# Patient Record
Sex: Female | Born: 1965
Health system: Southern US, Community
[De-identification: ages and names within clinical notes are randomized; demographics above are authoritative.]

## PROBLEM LIST (undated history)

## (undated) DIAGNOSIS — M1990 Unspecified osteoarthritis, unspecified site: Secondary | ICD-10-CM

## (undated) DIAGNOSIS — G4700 Insomnia, unspecified: Secondary | ICD-10-CM

## (undated) DIAGNOSIS — R197 Diarrhea, unspecified: Secondary | ICD-10-CM

## (undated) DIAGNOSIS — K589 Irritable bowel syndrome without diarrhea: Secondary | ICD-10-CM

## (undated) DIAGNOSIS — T8859XA Other complications of anesthesia, initial encounter: Secondary | ICD-10-CM

## (undated) DIAGNOSIS — Z86010 Personal history of colonic polyps: Secondary | ICD-10-CM

## (undated) DIAGNOSIS — R05 Cough: Secondary | ICD-10-CM

## (undated) DIAGNOSIS — M2540 Effusion, unspecified joint: Secondary | ICD-10-CM

## (undated) DIAGNOSIS — F32A Depression, unspecified: Secondary | ICD-10-CM

## (undated) DIAGNOSIS — M549 Dorsalgia, unspecified: Secondary | ICD-10-CM

## (undated) DIAGNOSIS — N393 Stress incontinence (female) (male): Secondary | ICD-10-CM

## (undated) DIAGNOSIS — M5030 Other cervical disc degeneration, unspecified cervical region: Secondary | ICD-10-CM

## (undated) DIAGNOSIS — R059 Cough, unspecified: Secondary | ICD-10-CM

## (undated) DIAGNOSIS — E039 Hypothyroidism, unspecified: Secondary | ICD-10-CM

## (undated) DIAGNOSIS — Z8709 Personal history of other diseases of the respiratory system: Secondary | ICD-10-CM

## (undated) DIAGNOSIS — G8929 Other chronic pain: Secondary | ICD-10-CM

## (undated) DIAGNOSIS — J189 Pneumonia, unspecified organism: Secondary | ICD-10-CM

## (undated) DIAGNOSIS — M2550 Pain in unspecified joint: Secondary | ICD-10-CM

## (undated) DIAGNOSIS — T4145XA Adverse effect of unspecified anesthetic, initial encounter: Secondary | ICD-10-CM

## (undated) DIAGNOSIS — K219 Gastro-esophageal reflux disease without esophagitis: Secondary | ICD-10-CM

## (undated) DIAGNOSIS — F329 Major depressive disorder, single episode, unspecified: Secondary | ICD-10-CM

## (undated) HISTORY — PX: EYE SURGERY: SHX253

## (undated) HISTORY — PX: COLONOSCOPY: SHX174

## (undated) HISTORY — PX: CERVICAL SPINE SURGERY: SHX589

## (undated) HISTORY — PX: BACK SURGERY: SHX140

## (undated) HISTORY — PX: CHOLECYSTECTOMY: SHX55

## (undated) HISTORY — PX: ESOPHAGOGASTRODUODENOSCOPY: SHX1529

## (undated) HISTORY — PX: ABDOMINAL HYSTERECTOMY: SHX81

---

## 2004-06-26 ENCOUNTER — Ambulatory Visit: Payer: Self-pay | Admitting: Family Medicine

## 2004-07-23 ENCOUNTER — Ambulatory Visit: Payer: Self-pay | Admitting: Family Medicine

## 2004-09-01 ENCOUNTER — Emergency Department: Payer: Self-pay | Admitting: General Practice

## 2005-09-13 IMAGING — CT CT ANGIOGRAPHY NECK
1 of 8 series · 8 of 33 positions shown · IV contrast (APPLIED)
Comparison: none

REASON FOR EXAM: ABN MRI showed asymmetric signal intensity LT distal
internal carotid
COMMENTS:

[Series 6: inspace · axial · 0.33mm/px · z∈[+90,+294]mm · 8 of 522 slices shown]
[im 58/522  soft-tissue]
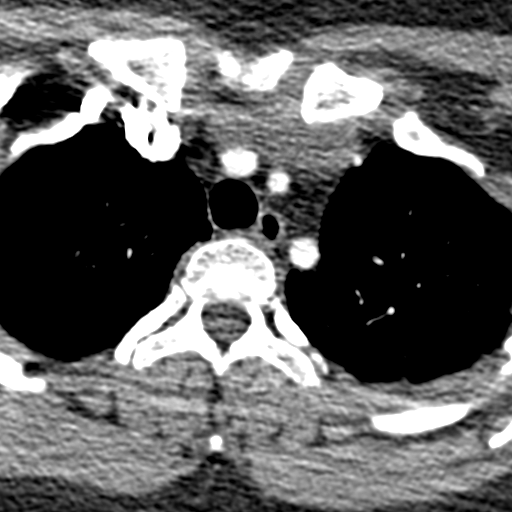
[im 116/522  bone]
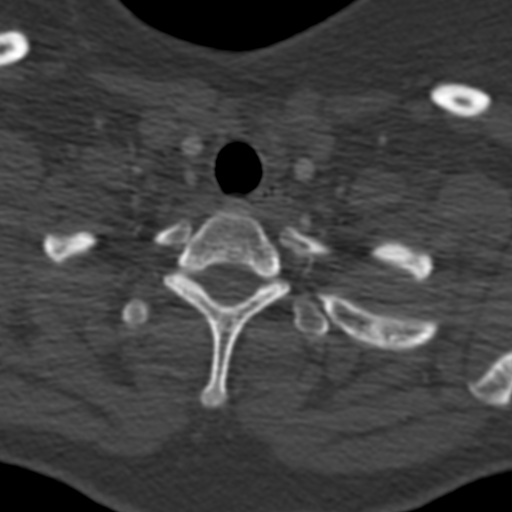
[im 174/522  soft-tissue]
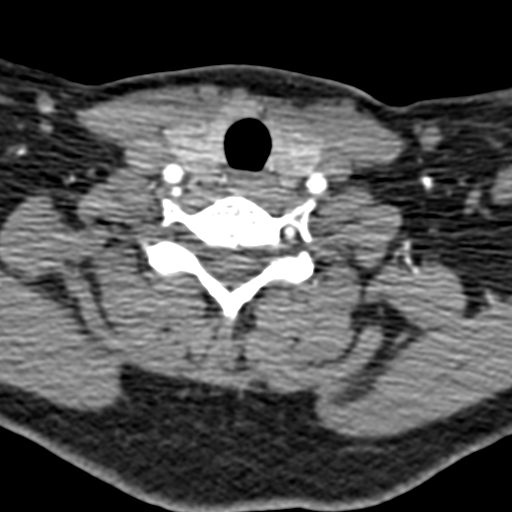
[im 232/522  bone]
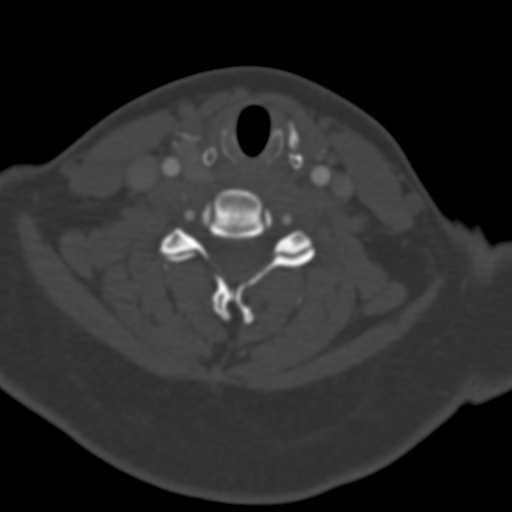
[im 290/522  soft-tissue]
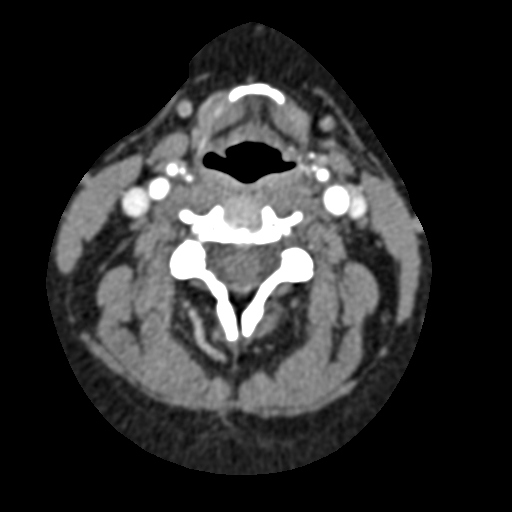
[im 348/522  bone]
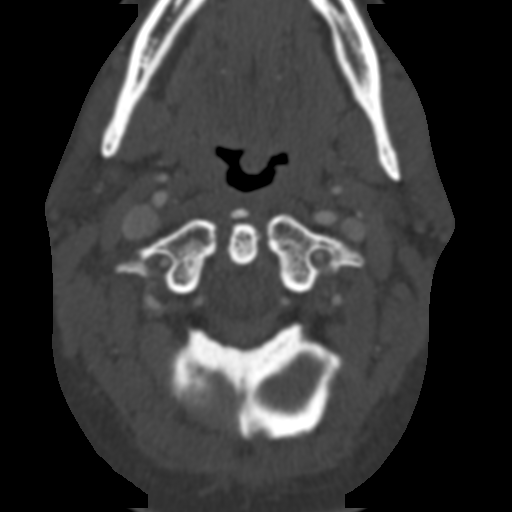
[im 406/522  soft-tissue]
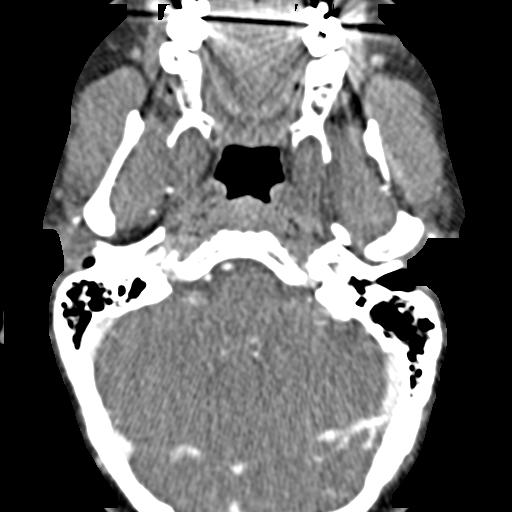
[im 464/522  bone]
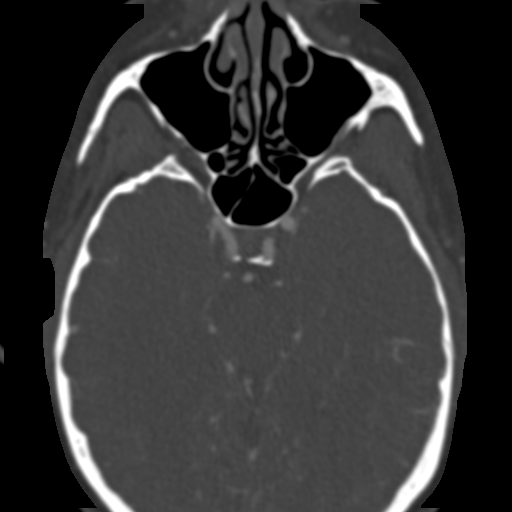

[8 of 33 positions shown; findings below may reference images not displayed]

PROCEDURE:     CT  - CT ANGIOGRAPHY NECK W/WO  - July 23, 2004 [DATE]

RESULT:     Axial and coronal maximum intensity projections as well as 3D
reconstructions and vessel view images were performed of the RIGHT and LEFT
carotid systems status post intravenous administration of 100 ccs of Isovue
370.  Evaluation of the RIGHT and LEFT carotid systems demonstrates no
regions of strictural narrowing, fusiform dilatation, segmental stenosis nor
focal outpouchings.  There does not appear to be evidence of significant
mural calcifications.
IMPRESSION: 1)Unremarkable bilateral CT carotid angiogram as described above.

## 2005-12-04 ENCOUNTER — Emergency Department: Payer: Self-pay | Admitting: Unknown Physician Specialty

## 2008-02-24 ENCOUNTER — Ambulatory Visit: Payer: Self-pay | Admitting: Obstetrics and Gynecology

## 2008-03-02 ENCOUNTER — Ambulatory Visit: Payer: Self-pay | Admitting: Obstetrics and Gynecology

## 2008-04-05 ENCOUNTER — Observation Stay: Payer: Self-pay | Admitting: Unknown Physician Specialty

## 2009-06-29 ENCOUNTER — Inpatient Hospital Stay: Payer: Self-pay | Admitting: Psychiatry

## 2012-01-05 ENCOUNTER — Ambulatory Visit: Payer: Self-pay | Admitting: Emergency Medicine

## 2012-10-04 ENCOUNTER — Emergency Department: Payer: Self-pay | Admitting: Emergency Medicine

## 2012-11-03 ENCOUNTER — Emergency Department: Payer: Self-pay | Admitting: Emergency Medicine

## 2012-11-03 LAB — BASIC METABOLIC PANEL
BUN: 22 mg/dL — ABNORMAL HIGH (ref 7–18)
Chloride: 105 mmol/L (ref 98–107)
Creatinine: 1.1 mg/dL (ref 0.60–1.30)
Glucose: 92 mg/dL (ref 65–99)
Potassium: 4 mmol/L (ref 3.5–5.1)

## 2012-11-03 LAB — CBC
HGB: 12.9 g/dL (ref 12.0–16.0)
MCHC: 34.9 g/dL (ref 32.0–36.0)
Platelet: 279 10*3/uL (ref 150–440)
RBC: 4.12 10*6/uL (ref 3.80–5.20)
WBC: 7.5 10*3/uL (ref 3.6–11.0)

## 2012-11-04 ENCOUNTER — Ambulatory Visit: Payer: Self-pay | Admitting: Family Medicine

## 2012-11-29 ENCOUNTER — Ambulatory Visit: Payer: Self-pay | Admitting: Specialist

## 2013-04-28 DIAGNOSIS — Z8709 Personal history of other diseases of the respiratory system: Secondary | ICD-10-CM

## 2013-04-28 HISTORY — DX: Personal history of other diseases of the respiratory system: Z87.09

## 2014-01-21 ENCOUNTER — Emergency Department: Payer: Self-pay | Admitting: Emergency Medicine

## 2014-01-21 LAB — URINALYSIS, COMPLETE
Bacteria: NONE SEEN
Bilirubin,UR: NEGATIVE
Glucose,UR: NEGATIVE mg/dL (ref 0–75)
Hyaline Cast: 6
Ketone: NEGATIVE
LEUKOCYTE ESTERASE: NEGATIVE
Nitrite: NEGATIVE
PROTEIN: NEGATIVE
Ph: 5 (ref 4.5–8.0)
Specific Gravity: 1.02 (ref 1.003–1.030)
WBC UR: NONE SEEN /HPF (ref 0–5)

## 2014-01-21 LAB — COMPREHENSIVE METABOLIC PANEL
ALBUMIN: 3.9 g/dL (ref 3.4–5.0)
ANION GAP: 5 — AB (ref 7–16)
Alkaline Phosphatase: 80 U/L
BILIRUBIN TOTAL: 0.5 mg/dL (ref 0.2–1.0)
BUN: 15 mg/dL (ref 7–18)
CALCIUM: 8.9 mg/dL (ref 8.5–10.1)
CHLORIDE: 104 mmol/L (ref 98–107)
CREATININE: 0.97 mg/dL (ref 0.60–1.30)
Co2: 31 mmol/L (ref 21–32)
EGFR (African American): >60
EGFR (Non-African Amer.): >60
Glucose: 110 mg/dL — ABNORMAL HIGH (ref 65–99)
Osmolality: 281 (ref 275–301)
POTASSIUM: 3.7 mmol/L (ref 3.5–5.1)
SGOT(AST): 37 U/L (ref 15–37)
SGPT (ALT): 30 U/L
Sodium: 140 mmol/L (ref 136–145)
Total Protein: 7.2 g/dL (ref 6.4–8.2)

## 2014-01-21 LAB — CBC
HCT: 39.8 % (ref 35.0–47.0)
HGB: 13 g/dL (ref 12.0–16.0)
MCH: 29.8 pg (ref 26.0–34.0)
MCHC: 32.7 g/dL (ref 32.0–36.0)
MCV: 91 fL (ref 80–100)
PLATELETS: 269 10*3/uL (ref 150–440)
RBC: 4.37 10*6/uL (ref 3.80–5.20)
RDW: 13.3 % (ref 11.5–14.5)
WBC: 5.8 10*3/uL (ref 3.6–11.0)

## 2014-01-21 LAB — LIPASE, BLOOD: LIPASE: 158 U/L (ref 73–393)

## 2014-01-25 ENCOUNTER — Ambulatory Visit: Payer: Self-pay | Admitting: Obstetrics and Gynecology

## 2014-03-13 ENCOUNTER — Emergency Department: Payer: Self-pay | Admitting: Emergency Medicine

## 2014-07-17 ENCOUNTER — Emergency Department: Payer: Self-pay | Admitting: Emergency Medicine

## 2014-11-07 ENCOUNTER — Other Ambulatory Visit: Payer: Self-pay | Admitting: *Deleted

## 2014-11-07 DIAGNOSIS — M545 Low back pain, unspecified: Secondary | ICD-10-CM

## 2014-11-07 DIAGNOSIS — M5440 Lumbago with sciatica, unspecified side: Secondary | ICD-10-CM

## 2014-11-07 DIAGNOSIS — M5136 Other intervertebral disc degeneration, lumbar region: Secondary | ICD-10-CM

## 2014-11-14 ENCOUNTER — Ambulatory Visit
Admission: RE | Admit: 2014-11-14 | Discharge: 2014-11-14 | Disposition: A | Discharge status: Home / Self Care | Payer: 59 | Source: Ambulatory Visit | Attending: Orthopedic Surgery | Admitting: Orthopedic Surgery

## 2014-11-14 DIAGNOSIS — M5136 Other intervertebral disc degeneration, lumbar region: Secondary | ICD-10-CM | POA: Diagnosis present

## 2014-11-14 DIAGNOSIS — M5440 Lumbago with sciatica, unspecified side: Secondary | ICD-10-CM

## 2014-11-14 DIAGNOSIS — M545 Low back pain, unspecified: Secondary | ICD-10-CM

## 2015-03-24 ENCOUNTER — Emergency Department: Payer: 59

## 2015-03-24 ENCOUNTER — Encounter: Payer: Self-pay | Admitting: Emergency Medicine

## 2015-03-24 ENCOUNTER — Emergency Department
Admission: EM | Admit: 2015-03-24 | Discharge: 2015-03-24 | Disposition: A | Discharge status: Home / Self Care | Payer: 59 | Attending: Emergency Medicine | Admitting: Emergency Medicine

## 2015-03-24 DIAGNOSIS — Y9389 Activity, other specified: Secondary | ICD-10-CM | POA: Diagnosis not present

## 2015-03-24 DIAGNOSIS — S29002A Unspecified injury of muscle and tendon of back wall of thorax, initial encounter: Secondary | ICD-10-CM | POA: Diagnosis present

## 2015-03-24 DIAGNOSIS — W1839XA Other fall on same level, initial encounter: Secondary | ICD-10-CM | POA: Diagnosis not present

## 2015-03-24 DIAGNOSIS — Z880 Allergy status to penicillin: Secondary | ICD-10-CM | POA: Diagnosis not present

## 2015-03-24 DIAGNOSIS — Y9289 Other specified places as the place of occurrence of the external cause: Secondary | ICD-10-CM | POA: Insufficient documentation

## 2015-03-24 DIAGNOSIS — S20221A Contusion of right back wall of thorax, initial encounter: Secondary | ICD-10-CM | POA: Diagnosis not present

## 2015-03-24 DIAGNOSIS — F17210 Nicotine dependence, cigarettes, uncomplicated: Secondary | ICD-10-CM | POA: Insufficient documentation

## 2015-03-24 DIAGNOSIS — Y998 Other external cause status: Secondary | ICD-10-CM | POA: Insufficient documentation

## 2015-03-24 MED ORDER — ORPHENADRINE CITRATE 30 MG/ML IJ SOLN
60.0000 mg | Freq: Two times a day (BID) | INTRAMUSCULAR | Status: DC
  Administered 2015-03-24: 60 mg via INTRAMUSCULAR
  Filled 2015-03-24: qty 2

## 2015-03-24 MED ORDER — HYDROMORPHONE HCL 1 MG/ML IJ SOLN
1.0000 mg | Freq: Once | INTRAMUSCULAR | Status: AC
  Administered 2015-03-24: 1 mg via INTRAMUSCULAR
  Filled 2015-03-24: qty 1

## 2015-03-24 NOTE — ED Provider Notes (Signed)
[facility] Emergency Department Provider Note  ____________________________________________  Time seen: Approximately 10:07 PM  I have reviewed the triage vital signs and the nursing notes.   HISTORY  Chief Complaint Back Pain    HPI Veronica Flynn is a 49 y.o. female patient complaining of for back pain secondary to a fall. Patient fell 4 days ago and fractured her right elbow. She got up and back pain at that time but noticed a "progressively worse over the past 3 days. Patient states the pain also seems increases when she takes a deep breath. Patient rated the pain as a 10 over 10.Patient is on chronic pain management.   History reviewed. No pertinent past medical history.  There are no active problems to display for this patient.   Past Surgical History  Procedure Laterality Date  . Cervical spine surgery    . Abdominal hysterectomy      No current outpatient prescriptions on file.  Allergies Penicillins; Sulfa antibiotics; and Tramadol  No family history on file.  Social History Social History  Substance Use Topics  . Smoking status: Current Every Day Smoker -- 0.50 packs/day    Types: Cigarettes  . Smokeless tobacco: None  . Alcohol Use: No    Review of Systems Constitutional: No fever/chills Eyes: No visual changes. ENT: No sore throat. Cardiovascular: Denies chest pain. Respiratory: Denies shortness of breath. Gastrointestinal: No abdominal pain.  No nausea, no vomiting.  No diarrhea.  No constipation. Genitourinary: Negative for dysuria. Musculoskeletal: Positive upper back pain.  Skin: Negative for rash. Neurological: Negative for headaches, focal weakness or numbness. 10-point ROS otherwise negative.  ____________________________________________   PHYSICAL EXAM:  VITAL SIGNS: ED Triage Vitals  Enc Vitals Group     BP 03/24/15 2142 132/77 mmHg     Pulse Rate 03/24/15 2142 73     Resp 03/24/15 2142 20     Temp  03/24/15 2142 98.4 F (36.9 C)     Temp Source 03/24/15 2142 Oral     SpO2 03/24/15 2142 96 %     Weight --      Height --      Head Cir --      Peak Flow --      Pain Score 03/24/15 2143 10     Pain Loc --      Pain Edu? --      Excl. in GC? --     Constitutional: Alert and oriented. Well appearing and in no acute distress. Eyes: Conjunctivae are normal. PERRL. EOMI. Head: Atraumatic. Nose: No congestion/rhinnorhea. Mouth/Throat: Mucous membranes are moist.  Oropharynx non-erythematous. Neck: No stridor.  No cervical spine tenderness to palpation. Hematological/Lymphatic/Immunilogical: No cervical lymphadenopathy. Cardiovascular: Normal rate, regular rhythm. Grossly normal heart sounds.  Good peripheral circulation. Respiratory: Normal respiratory effort.  No retractions. Lungs CTAB. Gastrointestinal: Soft and nontender. No distention. No abdominal bruits. No CVA tenderness. Musculoskeletal: Surgical scar on lower C-spine area consistent history of prior surgery. No spinal deformity. Patient is tender palpation T6 and 7. Patient has decreased range of motion right upper extremity secondary to a fractured elbow. .Neurologic:  Normal speech and language. No gross focal neurologic deficits are appreciated. No gait instability. Skin:  Skin is warm, dry and intact. No rash noted. Psychiatric: Mood and affect are normal. Speech and behavior are normal.  ____________________________________________   LABS (all labs ordered are listed, but only abnormal results are displayed)  Labs Reviewed - No data to display ____________________________________________  EKG  ____________________________________________  RADIOLOGY  No acute findings on x-ray. I, Joni Reiningonald K Soua Caltagirone, personally viewed and evaluated these images (plain radiographs) as part of my medical decision making.   ____________________________________________   PROCEDURES  Procedure(s) performed:   Critical Care  performed: No  ____________________________________________   INITIAL IMPRESSION / ASSESSMENT AND PLAN / ED COURSE  Pertinent labs & imaging results that were available during my care of the patient were reviewed by me and considered in my medical decision making (see chart for details).  Upper back contusion. Discussed x-ray findings with patient. Patient advised to continue narcotic pain medications from her provider. Last follow-up with PCP if her complaint upper back pain persists. ____________________________________________   FINAL CLINICAL IMPRESSION(S) / ED DIAGNOSES  Final diagnoses:  Contusion of upper back, right, initial encounter      Joni ReiningRonald K Torrion Witter, PA-C 03/24/15 2249  Minna AntisKevin Paduchowski, MD 03/24/15 2329

## 2015-03-24 NOTE — Discharge Instructions (Signed)

## 2015-03-24 NOTE — ED Notes (Signed)
Patient with no complaints at this time. Respirations even and unlabored. Skin warm/dry. Discharge instructions reviewed with patient at this time. Patient given opportunity to voice concerns/ask questions. Patient discharged at this time and left Emergency Department, via wheelchair.   

## 2015-03-24 NOTE — ED Notes (Signed)
Pt fell on Tuesday and sought tx, it was determined she had a right elbow fx, but she is presenting today with lower right back pain that causes her to have SOB when she is having a pain exacerbation.

## 2015-03-27 ENCOUNTER — Other Ambulatory Visit (HOSPITAL_COMMUNITY): Payer: Self-pay | Admitting: Neurosurgery

## 2015-03-29 HISTORY — PX: LUMBAR LAMINECTOMY: SHX95

## 2015-04-18 ENCOUNTER — Inpatient Hospital Stay (HOSPITAL_COMMUNITY)
Admission: RE | Admit: 2015-04-18 | Discharge: 2015-04-18 | Disposition: A | Discharge status: Home / Self Care | Payer: 59 | Source: Ambulatory Visit

## 2015-04-18 NOTE — Pre-Procedure Instructions (Signed)
Veronica Flynn  04/18/2015      [id number] [place]  - [address] [place] [id number] Phone: [phone] Fax: [phone]    Your procedure is scheduled on Friday, Dec. 30th   [facility] Admitting at 10:00 AM  Call this number if you have problems the morning of surgery:  [phone]   Remember:  Do not eat food or drink liquids after midnight.  Take these medicines the morning of surgery with A SIP OF WATER Thursday.   Do not wear jewelry, make-up or nail polish.  Do not wear lotions, powders, or perfumes.  You may NOT wear deodorant the day of surgery.  Do not shave underarms & legs 48 hours prior to surgery.     Do not bring valuables to the hospital.  [facility] is not responsible for any belongings or valuables.  Contacts, dentures or bridgework may not be worn into surgery.  Leave your suitcase in the car.  After surgery it may be brought to your room.  For patients admitted to the hospital, discharge time will be determined by your treatment team.  Name and phone number of your driver:     Please read over the following fact sheets that you were given. Pain Booklet, Coughing and Deep Breathing, Blood Transfusion Information, Total Joint Packet and MRSA Information

## 2015-04-26 ENCOUNTER — Encounter (HOSPITAL_COMMUNITY)
Admission: RE | Admit: 2015-04-26 | Discharge: 2015-04-26 | Disposition: A | Discharge status: Home / Self Care | Payer: 59 | Source: Ambulatory Visit | Attending: Neurosurgery | Admitting: Neurosurgery

## 2015-04-26 ENCOUNTER — Encounter (HOSPITAL_COMMUNITY): Payer: Self-pay

## 2015-04-26 HISTORY — DX: Cough: R05

## 2015-04-26 HISTORY — DX: Pain in unspecified joint: M25.50

## 2015-04-26 HISTORY — DX: Cough, unspecified: R05.9

## 2015-04-26 HISTORY — DX: Pneumonia, unspecified organism: J18.9

## 2015-04-26 HISTORY — DX: Other chronic pain: G89.29

## 2015-04-26 HISTORY — DX: Personal history of colonic polyps: Z86.010

## 2015-04-26 HISTORY — DX: Hypothyroidism, unspecified: E03.9

## 2015-04-26 HISTORY — DX: Other complications of anesthesia, initial encounter: T88.59XA

## 2015-04-26 HISTORY — DX: Gastro-esophageal reflux disease without esophagitis: K21.9

## 2015-04-26 HISTORY — DX: Personal history of other diseases of the respiratory system: Z87.09

## 2015-04-26 HISTORY — DX: Stress incontinence (female) (male): N39.3

## 2015-04-26 HISTORY — DX: Depression, unspecified: F32.A

## 2015-04-26 HISTORY — DX: Insomnia, unspecified: G47.00

## 2015-04-26 HISTORY — DX: Dorsalgia, unspecified: M54.9

## 2015-04-26 HISTORY — DX: Diarrhea, unspecified: R19.7

## 2015-04-26 HISTORY — DX: Adverse effect of unspecified anesthetic, initial encounter: T41.45XA

## 2015-04-26 HISTORY — DX: Irritable bowel syndrome without diarrhea: K58.9

## 2015-04-26 HISTORY — DX: Effusion, unspecified joint: M25.40

## 2015-04-26 HISTORY — DX: Major depressive disorder, single episode, unspecified: F32.9

## 2015-04-26 LAB — CBC WITH DIFFERENTIAL/PLATELET
BASOS ABS: 0.1 10*3/uL (ref 0.0–0.1)
BASOS PCT: 1 %
EOS ABS: 0.3 10*3/uL (ref 0.0–0.7)
Eosinophils Relative: 4 %
HCT: 43 % (ref 36.0–46.0)
HEMOGLOBIN: 14.2 g/dL (ref 12.0–15.0)
Lymphocytes Relative: 35 %
Lymphs Abs: 2.2 10*3/uL (ref 0.7–4.0)
MCH: 30.5 pg (ref 26.0–34.0)
MCHC: 33 g/dL (ref 30.0–36.0)
MCV: 92.5 fL (ref 78.0–100.0)
MONOS PCT: 5 %
Monocytes Absolute: 0.3 10*3/uL (ref 0.1–1.0)
NEUTROS ABS: 3.4 10*3/uL (ref 1.7–7.7)
NEUTROS PCT: 55 %
Platelets: 253 10*3/uL (ref 150–400)
RBC: 4.65 MIL/uL (ref 3.87–5.11)
RDW: 13.9 % (ref 11.5–15.5)
WBC: 6.2 10*3/uL (ref 4.0–10.5)

## 2015-04-26 LAB — SURGICAL PCR SCREEN
MRSA, PCR: NEGATIVE
STAPHYLOCOCCUS AUREUS: NEGATIVE

## 2015-04-26 LAB — TYPE AND SCREEN
ABO/RH(D): A POS
ANTIBODY SCREEN: NEGATIVE

## 2015-04-26 LAB — ABO/RH: ABO/RH(D): A POS

## 2015-04-26 MED ORDER — DEXAMETHASONE SODIUM PHOSPHATE 10 MG/ML IJ SOLN
10.0000 mg | INTRAMUSCULAR | Status: DC
  Filled 2015-04-26: qty 1

## 2015-04-26 MED ORDER — VANCOMYCIN HCL 10 G IV SOLR
1500.0000 mg | INTRAVENOUS | Status: AC
  Administered 2015-04-27: 1500 mg via INTRAVENOUS
  Filled 2015-04-26: qty 1500

## 2015-04-26 NOTE — Progress Notes (Addendum)
Medical Md is Dr.James Network engineerHedrick  Cardiologist denies having one  Stress test done about 10 yrs ago but can't recall as to why it was done  Denies echo or heart cath ever being done  EKG denies having one in past yr  CXR denies having one in past yr

## 2015-04-26 NOTE — Pre-Procedure Instructions (Signed)
Veronica Flynn  04/26/2015      [id number] - [place] - [address] [place] [address] Phone: [phone] Fax: [phone]    Your procedure is scheduled on Fri, Dec 30 @ 9:40 AM  Report to [facility] Admitting at 7:40 AM  Call this number if you have problems the morning of surgery:  [phone]   Remember:  Do not eat food or drink liquids after midnight.  Take these medicines the morning of surgery with A SIP OF WATER Levothyroxine(Synthroid),Pantoprazole(Protonix),and Paroxetine(Paxil)              No Meloxicam. No Goody's,BC's,Aleve,Aspirin,Advil,Motrin,Fish Oil,or any Herbal Medications.    Do not wear jewelry, make-up or nail polish.  Do not wear lotions, powders, or perfumes.  You may wear deodorant.  Do not shave 48 hours prior to surgery.    Do not bring valuables to the hospital.  [facility] is not responsible for any belongings or valuables.  Contacts, dentures or bridgework may not be worn into surgery.  Leave your suitcase in the car.  After surgery it may be brought to your room.  For patients admitted to the hospital, discharge time will be determined by your treatment team.  Patients discharged the day of surgery will not be allowed to drive home.    Special instructions:  [place] - Preparing for Surgery  Before surgery, you can play an important role.  Because skin is not sterile, your skin needs to be as free of germs as possible.  You can reduce the number of germs on you skin by washing with CHG (chlorahexidine gluconate) soap before surgery.  CHG is an antiseptic cleaner which kills germs and bonds with the skin to continue killing germs even after washing.  Please DO NOT use if you have an allergy to CHG or antibacterial soaps.  If your skin becomes reddened/irritated stop using the CHG and inform your nurse when you arrive at Short Stay.  Do not shave (including legs and underarms) for at least 48 hours  prior to the first CHG shower.  You may shave your face.  Please follow these instructions carefully:   1.  Shower with CHG Soap the night before surgery and the                                morning of Surgery.  2.  If you choose to wash your hair, wash your hair first as usual with your       normal shampoo.  3.  After you shampoo, rinse your hair and body thoroughly to remove the                      Shampoo.  4.  Use CHG as you would any other liquid soap.  You can apply chg directly       to the skin and wash gently with scrungie or a clean washcloth.  5.  Apply the CHG Soap to your body ONLY FROM THE NECK DOWN.        Do not use on open wounds or open sores.  Avoid contact with your eyes,       ears, mouth and genitals (private parts).  Wash genitals (private parts)       with your normal soap.  6.  Wash thoroughly, paying special attention to the  area where your surgery        will be performed.  7.  Thoroughly rinse your body with warm water from the neck down.  8.  DO NOT shower/wash with your normal soap after using and rinsing off       the CHG Soap.  9.  Pat yourself dry with a clean towel.            10.  Wear clean pajamas.            11.  Place clean sheets on your bed the night of your first shower and do not        sleep with pets.  Day of Surgery  Do not apply any lotions/deoderants the morning of surgery.  Please wear clean clothes to the hospital/surgery center.    Please read over the following fact sheets that you were given. Pain Booklet, Coughing and Deep Breathing, Blood Transfusion Information, Surgical Site Infection Prevention and Anesthesia Post-op Instructions

## 2015-04-27 ENCOUNTER — Inpatient Hospital Stay (HOSPITAL_COMMUNITY): Payer: 59 | Admitting: Anesthesiology

## 2015-04-27 ENCOUNTER — Inpatient Hospital Stay (HOSPITAL_COMMUNITY)
Admission: RE | Admit: 2015-04-27 | Discharge: 2015-04-28 | DRG: 460 | Disposition: A | Discharge status: Home / Self Care | Payer: 59 | Source: Ambulatory Visit | Attending: Neurosurgery | Admitting: Neurosurgery

## 2015-04-27 ENCOUNTER — Encounter (HOSPITAL_COMMUNITY): Payer: Self-pay | Admitting: Anesthesiology

## 2015-04-27 ENCOUNTER — Inpatient Hospital Stay (HOSPITAL_COMMUNITY): Payer: 59

## 2015-04-27 ENCOUNTER — Encounter (HOSPITAL_COMMUNITY)
Admission: RE | Disposition: A | Discharge status: Home / Self Care | Payer: 59 | Source: Ambulatory Visit | Attending: Neurosurgery

## 2015-04-27 DIAGNOSIS — M549 Dorsalgia, unspecified: Secondary | ICD-10-CM | POA: Diagnosis present

## 2015-04-27 DIAGNOSIS — M4806 Spinal stenosis, lumbar region: Principal | ICD-10-CM | POA: Diagnosis present

## 2015-04-27 DIAGNOSIS — F329 Major depressive disorder, single episode, unspecified: Secondary | ICD-10-CM | POA: Diagnosis present

## 2015-04-27 DIAGNOSIS — K219 Gastro-esophageal reflux disease without esophagitis: Secondary | ICD-10-CM | POA: Diagnosis present

## 2015-04-27 DIAGNOSIS — M5137 Other intervertebral disc degeneration, lumbosacral region: Secondary | ICD-10-CM | POA: Diagnosis present

## 2015-04-27 DIAGNOSIS — Z419 Encounter for procedure for purposes other than remedying health state, unspecified: Secondary | ICD-10-CM

## 2015-04-27 DIAGNOSIS — M5136 Other intervertebral disc degeneration, lumbar region: Secondary | ICD-10-CM | POA: Diagnosis present

## 2015-04-27 DIAGNOSIS — E039 Hypothyroidism, unspecified: Secondary | ICD-10-CM | POA: Diagnosis present

## 2015-04-27 DIAGNOSIS — K580 Irritable bowel syndrome with diarrhea: Secondary | ICD-10-CM | POA: Diagnosis present

## 2015-04-27 DIAGNOSIS — M51369 Other intervertebral disc degeneration, lumbar region without mention of lumbar back pain or lower extremity pain: Secondary | ICD-10-CM | POA: Diagnosis present

## 2015-04-27 SURGERY — POSTERIOR LUMBAR FUSION 1 LEVEL
Anesthesia: General | Site: Back

## 2015-04-27 MED ORDER — EPHEDRINE SULFATE 50 MG/ML IJ SOLN
INTRAMUSCULAR | Status: AC
  Filled 2015-04-27: qty 3

## 2015-04-27 MED ORDER — MELOXICAM 7.5 MG PO TABS
15.0000 mg | ORAL_TABLET | Freq: Every day | ORAL | Status: DC
  Administered 2015-04-27 – 2015-04-28 (×2): 15 mg via ORAL
  Filled 2015-04-27 (×2): qty 2

## 2015-04-27 MED ORDER — SODIUM CHLORIDE 0.9 % IV SOLN: 250.0000 mL | INTRAVENOUS | Status: DC

## 2015-04-27 MED ORDER — LACTATED RINGERS IV SOLN
INTRAVENOUS | Status: DC
  Administered 2015-04-27: 10:00:00 via INTRAVENOUS

## 2015-04-27 MED ORDER — GABAPENTIN 300 MG PO CAPS
600.0000 mg | ORAL_CAPSULE | Freq: Every day | ORAL | Status: DC
  Administered 2015-04-27: 600 mg via ORAL
  Filled 2015-04-27: qty 2

## 2015-04-27 MED ORDER — FENTANYL CITRATE (PF) 100 MCG/2ML IJ SOLN
INTRAMUSCULAR | Status: AC
  Filled 2015-04-27: qty 2

## 2015-04-27 MED ORDER — LIDOCAINE HCL (CARDIAC) 20 MG/ML IV SOLN
INTRAVENOUS | Status: AC
  Filled 2015-04-27: qty 15

## 2015-04-27 MED ORDER — FENTANYL CITRATE (PF) 250 MCG/5ML IJ SOLN
INTRAMUSCULAR | Status: DC | PRN
  Administered 2015-04-27: 50 ug via INTRAVENOUS
  Administered 2015-04-27: 100 ug via INTRAVENOUS
  Administered 2015-04-27 (×2): 50 ug via INTRAVENOUS

## 2015-04-27 MED ORDER — OXYCODONE-ACETAMINOPHEN 5-325 MG PO TABS
1.0000 | ORAL_TABLET | ORAL | Status: DC | PRN
  Administered 2015-04-27 – 2015-04-28 (×3): 2 via ORAL
  Filled 2015-04-27 (×3): qty 2

## 2015-04-27 MED ORDER — NEOSTIGMINE METHYLSULFATE 10 MG/10ML IV SOLN
INTRAVENOUS | Status: AC
  Filled 2015-04-27: qty 3

## 2015-04-27 MED ORDER — ROCURONIUM BROMIDE 100 MG/10ML IV SOLN
INTRAVENOUS | Status: DC | PRN
  Administered 2015-04-27: 20 mg via INTRAVENOUS
  Administered 2015-04-27: 50 mg via INTRAVENOUS

## 2015-04-27 MED ORDER — PHENOL 1.4 % MT LIQD: 1.0000 | OROMUCOSAL | Status: DC | PRN

## 2015-04-27 MED ORDER — VANCOMYCIN HCL 1000 MG IV SOLR
INTRAVENOUS | Status: DC | PRN
  Administered 2015-04-27: 1000 mg via TOPICAL

## 2015-04-27 MED ORDER — MENTHOL 3 MG MT LOZG: 1.0000 | LOZENGE | OROMUCOSAL | Status: DC | PRN

## 2015-04-27 MED ORDER — SODIUM CHLORIDE 0.9 % IR SOLN
Status: DC | PRN
  Administered 2015-04-27: 13:00:00

## 2015-04-27 MED ORDER — HYDROCODONE-ACETAMINOPHEN 5-325 MG PO TABS: 1.0000 | ORAL_TABLET | ORAL | Status: DC | PRN

## 2015-04-27 MED ORDER — DIPHENHYDRAMINE HCL 50 MG/ML IJ SOLN
INTRAMUSCULAR | Status: AC
Start: 2015-04-27 — End: 2015-04-27
  Filled 2015-04-27: qty 1

## 2015-04-27 MED ORDER — SODIUM CHLORIDE 0.9 % IJ SOLN: 3.0000 mL | INTRAMUSCULAR | Status: DC | PRN

## 2015-04-27 MED ORDER — SUCCINYLCHOLINE CHLORIDE 20 MG/ML IJ SOLN
INTRAMUSCULAR | Status: AC
  Filled 2015-04-27: qty 2

## 2015-04-27 MED ORDER — METOCLOPRAMIDE HCL 5 MG/ML IJ SOLN: 10.0000 mg | Freq: Once | INTRAMUSCULAR | Status: DC | PRN

## 2015-04-27 MED ORDER — SODIUM CHLORIDE 0.9 % IJ SOLN
INTRAMUSCULAR | Status: AC
  Filled 2015-04-27: qty 30

## 2015-04-27 MED ORDER — PHENYLEPHRINE HCL 10 MG/ML IJ SOLN
INTRAMUSCULAR | Status: DC | PRN
  Administered 2015-04-27: 80 ug via INTRAVENOUS

## 2015-04-27 MED ORDER — VANCOMYCIN HCL 1000 MG IV SOLR
INTRAVENOUS | Status: AC
  Filled 2015-04-27: qty 1000

## 2015-04-27 MED ORDER — MEPERIDINE HCL 25 MG/ML IJ SOLN: 6.2500 mg | INTRAMUSCULAR | Status: DC | PRN

## 2015-04-27 MED ORDER — GLYCOPYRROLATE 0.2 MG/ML IJ SOLN
INTRAMUSCULAR | Status: AC
Start: 2015-04-27 — End: 2015-04-27
  Filled 2015-04-27: qty 6

## 2015-04-27 MED ORDER — PHENYLEPHRINE 40 MCG/ML (10ML) SYRINGE FOR IV PUSH (FOR BLOOD PRESSURE SUPPORT)
PREFILLED_SYRINGE | INTRAVENOUS | Status: AC
  Filled 2015-04-27: qty 20

## 2015-04-27 MED ORDER — EPHEDRINE SULFATE 50 MG/ML IJ SOLN
INTRAMUSCULAR | Status: DC | PRN
  Administered 2015-04-27: 5 mg via INTRAVENOUS

## 2015-04-27 MED ORDER — ONDANSETRON HCL 4 MG/2ML IJ SOLN
INTRAMUSCULAR | Status: DC | PRN
  Administered 2015-04-27: 4 mg via INTRAVENOUS

## 2015-04-27 MED ORDER — ROCURONIUM BROMIDE 50 MG/5ML IV SOLN
INTRAVENOUS | Status: AC
  Filled 2015-04-27: qty 4

## 2015-04-27 MED ORDER — SODIUM CHLORIDE 0.9 % IJ SOLN
3.0000 mL | Freq: Two times a day (BID) | INTRAMUSCULAR | Status: DC
  Administered 2015-04-27: 3 mL via INTRAVENOUS

## 2015-04-27 MED ORDER — 0.9 % SODIUM CHLORIDE (POUR BTL) OPTIME
TOPICAL | Status: DC | PRN
  Administered 2015-04-27: 1000 mL

## 2015-04-27 MED ORDER — PAROXETINE HCL ER 25 MG PO TB24
25.0000 mg | ORAL_TABLET | Freq: Two times a day (BID) | ORAL | Status: DC
  Administered 2015-04-27 – 2015-04-28 (×2): 25 mg via ORAL
  Filled 2015-04-27 (×3): qty 1

## 2015-04-27 MED ORDER — ONDANSETRON HCL 4 MG/2ML IJ SOLN
INTRAMUSCULAR | Status: AC
  Filled 2015-04-27: qty 6

## 2015-04-27 MED ORDER — FENTANYL CITRATE (PF) 100 MCG/2ML IJ SOLN
25.0000 ug | INTRAMUSCULAR | Status: DC | PRN
  Administered 2015-04-27 (×2): 50 ug via INTRAVENOUS

## 2015-04-27 MED ORDER — FENTANYL CITRATE (PF) 250 MCG/5ML IJ SOLN
INTRAMUSCULAR | Status: AC
  Filled 2015-04-27: qty 5

## 2015-04-27 MED ORDER — BUPIVACAINE HCL (PF) 0.25 % IJ SOLN
INTRAMUSCULAR | Status: DC | PRN
  Administered 2015-04-27: 20 mL

## 2015-04-27 MED ORDER — MIDAZOLAM HCL 2 MG/2ML IJ SOLN
INTRAMUSCULAR | Status: AC
  Filled 2015-04-27: qty 2

## 2015-04-27 MED ORDER — DIPHENHYDRAMINE HCL 50 MG/ML IJ SOLN
INTRAMUSCULAR | Status: DC | PRN
  Administered 2015-04-27: 12.5 mg via INTRAVENOUS

## 2015-04-27 MED ORDER — THROMBIN 20000 UNITS EX SOLR
CUTANEOUS | Status: DC | PRN
  Administered 2015-04-27: 13:00:00 via TOPICAL

## 2015-04-27 MED ORDER — ACETAMINOPHEN 650 MG RE SUPP: 650.0000 mg | RECTAL | Status: DC | PRN

## 2015-04-27 MED ORDER — LEVOTHYROXINE SODIUM 100 MCG PO TABS
100.0000 ug | ORAL_TABLET | Freq: Every day | ORAL | Status: DC
  Administered 2015-04-28: 100 ug via ORAL
  Filled 2015-04-27: qty 1

## 2015-04-27 MED ORDER — ONDANSETRON HCL 4 MG/2ML IJ SOLN: 4.0000 mg | INTRAMUSCULAR | Status: DC | PRN

## 2015-04-27 MED ORDER — PROPOFOL 10 MG/ML IV BOLUS
INTRAVENOUS | Status: DC | PRN
  Administered 2015-04-27: 130 mg via INTRAVENOUS

## 2015-04-27 MED ORDER — LACTATED RINGERS IV SOLN
INTRAVENOUS | Status: DC | PRN
  Administered 2015-04-27 (×2): via INTRAVENOUS

## 2015-04-27 MED ORDER — PANTOPRAZOLE SODIUM 40 MG PO TBEC
40.0000 mg | DELAYED_RELEASE_TABLET | Freq: Every day | ORAL | Status: DC
  Administered 2015-04-28: 40 mg via ORAL
  Filled 2015-04-27: qty 1

## 2015-04-27 MED ORDER — HYDROMORPHONE HCL 1 MG/ML IJ SOLN
0.5000 mg | INTRAMUSCULAR | Status: DC | PRN
  Administered 2015-04-27 – 2015-04-28 (×4): 1 mg via INTRAVENOUS
  Filled 2015-04-27 (×4): qty 1

## 2015-04-27 MED ORDER — LIDOCAINE HCL (CARDIAC) 20 MG/ML IV SOLN
INTRAVENOUS | Status: DC | PRN
  Administered 2015-04-27: 50 mg via INTRATRACHEAL

## 2015-04-27 MED ORDER — ARTIFICIAL TEARS OP OINT
TOPICAL_OINTMENT | OPHTHALMIC | Status: AC
  Filled 2015-04-27: qty 7

## 2015-04-27 MED ORDER — GLYCOPYRROLATE 0.2 MG/ML IJ SOLN
INTRAMUSCULAR | Status: DC | PRN
  Administered 2015-04-27: 0.6 mg via INTRAVENOUS

## 2015-04-27 MED ORDER — VANCOMYCIN HCL IN DEXTROSE 1-5 GM/200ML-% IV SOLN
1000.0000 mg | Freq: Once | INTRAVENOUS | Status: AC
  Administered 2015-04-27: 1000 mg via INTRAVENOUS
  Filled 2015-04-27: qty 200

## 2015-04-27 MED ORDER — DIAZEPAM 5 MG PO TABS
5.0000 mg | ORAL_TABLET | Freq: Four times a day (QID) | ORAL | Status: DC | PRN
  Administered 2015-04-27 – 2015-04-28 (×2): 5 mg via ORAL
  Filled 2015-04-27 (×2): qty 1

## 2015-04-27 MED ORDER — MIDAZOLAM HCL 5 MG/5ML IJ SOLN
INTRAMUSCULAR | Status: DC | PRN
  Administered 2015-04-27: 2 mg via INTRAVENOUS

## 2015-04-27 MED ORDER — NEOSTIGMINE METHYLSULFATE 10 MG/10ML IV SOLN
INTRAVENOUS | Status: DC | PRN
  Administered 2015-04-27: 4 mg via INTRAVENOUS

## 2015-04-27 MED ORDER — ACETAMINOPHEN 325 MG PO TABS: 650.0000 mg | ORAL_TABLET | ORAL | Status: DC | PRN

## 2015-04-27 MED FILL — Sodium Chloride IV Soln 0.9%: INTRAVENOUS | Qty: 1000 | Status: AC

## 2015-04-27 MED FILL — Heparin Sodium (Porcine) Inj 1000 Unit/ML: INTRAMUSCULAR | Qty: 30 | Status: AC

## 2015-04-27 SURGICAL SUPPLY — 56 items
BAG DECANTER FOR FLEXI CONT (MISCELLANEOUS) ×2 IMPLANT
BENZOIN TINCTURE PRP APPL 2/3 (GAUZE/BANDAGES/DRESSINGS) ×2 IMPLANT
BLADE CLIPPER SURG (BLADE) IMPLANT
BRUSH SCRUB EZ PLAIN DRY (MISCELLANEOUS) ×2 IMPLANT
BUR CUTTER 7.0 ROUND (BURR) ×2 IMPLANT
BUR MATCHSTICK NEURO 3.0 LAGG (BURR) ×2 IMPLANT
CANISTER SUCT 3000ML PPV (MISCELLANEOUS) ×2 IMPLANT
CAP LCK SPNE (Orthopedic Implant) ×4 IMPLANT
CAP LOCK SPINE RADIUS (Orthopedic Implant) ×4 IMPLANT
CAP LOCKING (Orthopedic Implant) ×4 IMPLANT
CONT SPEC 4OZ CLIKSEAL STRL BL (MISCELLANEOUS) ×2 IMPLANT
COVER BACK TABLE 60X90IN (DRAPES) ×2 IMPLANT
DECANTER SPIKE VIAL GLASS SM (MISCELLANEOUS) ×2 IMPLANT
DEVICE INTERBODY ELEVATE 9X23 (Cage) ×4 IMPLANT
DRAPE C-ARM 42X72 X-RAY (DRAPES) ×4 IMPLANT
DRAPE LAPAROTOMY 100X72X124 (DRAPES) ×2 IMPLANT
DRAPE POUCH INSTRU U-SHP 10X18 (DRAPES) ×2 IMPLANT
DRAPE PROXIMA HALF (DRAPES) IMPLANT
DRAPE SURG 17X23 STRL (DRAPES) ×8 IMPLANT
DRSG OPSITE POSTOP 4X6 (GAUZE/BANDAGES/DRESSINGS) ×2 IMPLANT
DURAPREP 26ML APPLICATOR (WOUND CARE) ×2 IMPLANT
ELECT REM PT RETURN 9FT ADLT (ELECTROSURGICAL) ×2
ELECTRODE REM PT RTRN 9FT ADLT (ELECTROSURGICAL) ×1 IMPLANT
EVACUATOR 1/8 PVC DRAIN (DRAIN) ×2 IMPLANT
GAUZE SPONGE 4X4 12PLY STRL (GAUZE/BANDAGES/DRESSINGS) ×2 IMPLANT
GAUZE SPONGE 4X4 16PLY XRAY LF (GAUZE/BANDAGES/DRESSINGS) IMPLANT
GLOVE ECLIPSE 9.0 STRL (GLOVE) ×4 IMPLANT
GLOVE EXAM NITRILE LRG STRL (GLOVE) IMPLANT
GLOVE EXAM NITRILE MD LF STRL (GLOVE) IMPLANT
GLOVE EXAM NITRILE XL STR (GLOVE) IMPLANT
GLOVE EXAM NITRILE XS STR PU (GLOVE) IMPLANT
GOWN STRL REUS W/ TWL LRG LVL3 (GOWN DISPOSABLE) IMPLANT
GOWN STRL REUS W/ TWL XL LVL3 (GOWN DISPOSABLE) ×2 IMPLANT
GOWN STRL REUS W/TWL 2XL LVL3 (GOWN DISPOSABLE) IMPLANT
GOWN STRL REUS W/TWL LRG LVL3 (GOWN DISPOSABLE)
GOWN STRL REUS W/TWL XL LVL3 (GOWN DISPOSABLE) ×2
KIT BASIN OR (CUSTOM PROCEDURE TRAY) ×2 IMPLANT
KIT ROOM TURNOVER OR (KITS) ×2 IMPLANT
LIQUID BAND (GAUZE/BANDAGES/DRESSINGS) ×2 IMPLANT
NEEDLE HYPO 22GX1.5 SAFETY (NEEDLE) ×2 IMPLANT
NS IRRIG 1000ML POUR BTL (IV SOLUTION) ×2 IMPLANT
PACK LAMINECTOMY NEURO (CUSTOM PROCEDURE TRAY) ×2 IMPLANT
ROD RADIUS 45MM (Rod) ×2 IMPLANT
ROD SPNL 45X5.5XNS TI RDS (Rod) ×2 IMPLANT
SCREW 5.75X40M (Screw) ×4 IMPLANT
SCREW 6.75X40MM (Screw) ×4 IMPLANT
SPONGE SURGIFOAM ABS GEL 100 (HEMOSTASIS) ×2 IMPLANT
STRIP CLOSURE SKIN 1/2X4 (GAUZE/BANDAGES/DRESSINGS) ×2 IMPLANT
SUT VIC AB 0 CT1 18XCR BRD8 (SUTURE) ×2 IMPLANT
SUT VIC AB 0 CT1 8-18 (SUTURE) ×2
SUT VIC AB 2-0 CT1 18 (SUTURE) ×2 IMPLANT
SUT VIC AB 3-0 SH 8-18 (SUTURE) ×4 IMPLANT
TOWEL OR 17X24 6PK STRL BLUE (TOWEL DISPOSABLE) ×2 IMPLANT
TOWEL OR 17X26 10 PK STRL BLUE (TOWEL DISPOSABLE) ×2 IMPLANT
TRAY FOLEY W/METER SILVER 14FR (SET/KITS/TRAYS/PACK) ×2 IMPLANT
WATER STERILE IRR 1000ML POUR (IV SOLUTION) ×2 IMPLANT

## 2015-04-27 NOTE — Op Note (Signed)
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  L5-S1 posterior lumbar interbody fusion utilizing interbody expandable cages and local autografting  L5-S1 posterior lateral arthrodesis utilizing nonsegmental pedicle screw instrumentation and local autograft.  Surgeon:Jackelin Correia A.Diyan Dave, M.D.  Asst. Surgeon: Lovell SheehanJenkins  Anesthesia: General  Indication: 49 year old female with chronic back pain and bilateral lower extremity radicular symptoms which is failed conservative management. Workup demonstrates evidence of progressive disc degeneration with foraminal stenosis at L5-S1. Patient presents now for decompression infusion in hopes of improving her symptoms.  Operative note: After induction of anesthesia, patient position prone onto Wilson frame and a properly padded. Lumbar region prepped and draped sterilely. Incision made overlying L5-S1. Dissection performed bilaterally. Retractor placed. Fluoroscopy used. Levels confirmed. Decompressive laminotomies performed bilaterally using high-speed drill, and Kerrison rongeurs to remove the inferior one half of the lamina of L5 the entire inferior facet and pars interarticularis of L5 and the majority of the superior facet of S1. Partial superior laminectomy of S1 was also performed. Ligament flavum was elevated and resected in piecemeal fashion. Wide decompressive foraminotomies were performed and completed along the course the L5 and S1 nerve roots. Bilateral discectomies were then performed. Disc space then distracted up to 10 mm. With the distractor left the patient's left side disc space was prepared for interbody fusion with various  scrapers and curettes. A 9 mm x 12 of low doses by 23 mm Medtronic expandable cage was impacted into place and expanded to its full extent. Distractor was removed and patient's contralateral side. Disc space once again prepared for interbody fusion. Morselize autograft was now packed into the interspace. A second cage packed with morselized autograft was impacted into place and expanded to its full extent. Pedicles of L5 and S1 were identified using surface landmarks and intraoperative fluoroscopy. Superficial bone overlying the pedicles were removed using high-speed drill. Each pedicles and probed using a pedicle awl each pedicle awl track was probed and found to be solidly within the bone. Each pedicle track was tapped with a screw tap. Each screw tap hole was probed and confirmed to be in good position. Dry Kermit medical radius brand screws were then placed bilaterally at L5 and S1. Residual facet joint and transverse process and sacral alar were decorticated. Morselize autograft was packed posterior laterally. Short segment titanium rod placed over the screws. Locking caps were placed over the screw heads. Locking caps were then engaged with the construct under compression. Final images revealed good position the bone graft and hardware at proper operative level with normal alignment of the spine. Wound is irrigated one final time. Gelfoam was placed topically for hemostasis. Vancomycin powder was placed the deep wound space. Wounds and closed in layers with Vicryl sutures. Steri-Strips and sterile dressing were applied. No apparent complications. Patient tolerated the procedure well and she returns to the recovery room postop.

## 2015-04-27 NOTE — Transfer of Care (Signed)
Immediate Anesthesia Transfer of Care Note  Patient: Veronica Flynn  Procedure(s) Performed: Procedure(s): Posterior Lumbar Interbody Fusion Lumbar five- Sacral One (N/A)  Patient Location: PACU  Anesthesia Type:General  Level of Consciousness: sedated  Airway & Oxygen Therapy: Patient Spontanous Breathing and Patient connected to face mask oxygen  Post-op Assessment: Report given to RN and Post -op Vital signs reviewed and stable  Post vital signs: Reviewed and stable  Last Vitals:  Filed Vitals:   04/27/15 0959  BP: 102/70  Pulse: 76  Temp: 36.6 C  Resp: 20    Complications: No apparent anesthesia complications

## 2015-04-27 NOTE — H&P (Signed)
Veronica Flynn is an 49 y.o. female.   Chief Complaint: Back pain HPI: 49 year old female with chronic intractable back pain with radiation to both lower extremities failing conservative management. Workup demonstrates evidence of progressive disc degeneration and foraminal collapse at L5-S1. Patient is failed conservative management and presents now for L5-S1 decompression and fusion.  Past Medical History  Diagnosis Date  . Hypothyroidism     takes Synthroid daily  . Depression     takes Paxil daily  . GERD (gastroesophageal reflux disease)     takes Protonix daily  . Complication of anesthesia     slow to wake up  . Cough     nonproductive  . Pneumonia 6-7 yrs ago    hx of  . History of bronchitis 2015  . Joint pain     right elbow and right knee  . Joint swelling     right knee  . Chronic back pain     DDD  . Diarrhea   . History of colon polyps     benign  . IBS (irritable bowel syndrome)   . Stress incontinence   . Insomnia     doesn't take any meds    Past Surgical History  Procedure Laterality Date  . Cervical spine surgery      fusion  . Abdominal hysterectomy    . Eye surgery Left     as a child  . Colonoscopy    . Esophagogastroduodenoscopy    . Cholecystectomy      History reviewed. No pertinent family history. Social History:  reports that she has been smoking Cigarettes.  She has a 23 pack-year smoking history. She does not have any smokeless tobacco history on file. She reports that she does not drink alcohol or use illicit drugs.  Allergies:  Allergies  Allergen Reactions  . Penicillins Swelling and Rash    Has patient had a PCN reaction causing immediate rash, facial/tongue/throat swelling, SOB or lightheadedness with hypotension: * Yes*    . Sulfa Antibiotics Nausea And Vomiting  . Tramadol Nausea And Vomiting    constipation    Medications Prior to Admission  Medication Sig Dispense Refill  . gabapentin (NEURONTIN) 300 MG capsule  Take 600 mg by mouth at bedtime.    Marland Kitchen. levothyroxine (SYNTHROID, LEVOTHROID) 100 MCG tablet Take 100 mcg by mouth daily.    . meloxicam (MOBIC) 15 MG tablet Take 15 mg by mouth daily.    . pantoprazole (PROTONIX) 40 MG tablet Take 40 mg by mouth daily.    Marland Kitchen. PARoxetine (PAXIL-CR) 25 MG 24 hr tablet Take 25 mg by mouth 2 (two) times daily.      Results for orders placed or performed during the hospital encounter of 04/26/15 (from the past 48 hour(s))  Surgical pcr screen     Status: None   Collection Time: 04/26/15  8:34 AM  Result Value Ref Range   MRSA, PCR NEGATIVE NEGATIVE   Staphylococcus aureus NEGATIVE NEGATIVE    Comment:        The Xpert SA Assay (FDA approved for NASAL specimens in patients over 51 years of age), is one component of a comprehensive surveillance program.  Test performance has been validated by [facility] for patients greater than or equal to 51 year old. It is not intended to diagnose infection nor to guide or monitor treatment.   CBC WITH DIFFERENTIAL     Status: None   Collection Time: 04/26/15  8:34 AM  Result Value  Ref Range   WBC 6.2 4.0 - 10.5 K/uL   RBC 4.65 3.87 - 5.11 MIL/uL   Hemoglobin 14.2 12.0 - 15.0 g/dL   HCT 52.8 41.3 - 24.4 %   MCV 92.5 78.0 - 100.0 fL   MCH 30.5 26.0 - 34.0 pg   MCHC 33.0 30.0 - 36.0 g/dL   RDW 01.0 27.2 - 53.6 %   Platelets 253 150 - 400 K/uL   Neutrophils Relative % 55 %   Neutro Abs 3.4 1.7 - 7.7 K/uL   Lymphocytes Relative 35 %   Lymphs Abs 2.2 0.7 - 4.0 K/uL   Monocytes Relative 5 %   Monocytes Absolute 0.3 0.1 - 1.0 K/uL   Eosinophils Relative 4 %   Eosinophils Absolute 0.3 0.0 - 0.7 K/uL   Basophils Relative 1 %   Basophils Absolute 0.1 0.0 - 0.1 K/uL  Type and screen     Status: None   Collection Time: 04/26/15  8:35 AM  Result Value Ref Range   ABO/RH(D) A POS    Antibody Screen NEG    Sample Expiration 05/10/2015    Extend sample reason NO TRANSFUSIONS OR PREGNANCY IN THE PAST 3 MONTHS   ABO/Rh      Status: None   Collection Time: 04/26/15  8:35 AM  Result Value Ref Range   ABO/RH(D) A POS    No results found.  Pertinent items are noted in HPI.  Blood pressure 102/70, pulse 76, temperature 97.8 F (36.6 C), temperature source Oral, resp. rate 20, weight 114.76 kg (253 lb), SpO2 98 %.  The patient is awake and alert. She is oriented and appropriate. Cranial nerve function is intact. Speech is fluent. Judgment and insight are intact. Motor and sensory examination extremities normal. Lumbar spine moderately tender. No bony abnormality. Moderate spasm. Straight leg raising equivocal bilaterally. Examination head ears eyes observed unremarkable. Chest and abdomen are benign. Extremities are free from injury deformity. Assessment/Plan  decompressive laminotomies and foraminotomies followed by posterior lumbar my fusion utilizing interbody cages and local autografting coupled with posterior lateral arthrodesis utilizing nonsegmental pedicle screw instrumentation. Risks and benefits of been explained. Patient wishes to proceed. Veronica Flynn A 04/27/2015, 11:49 AM

## 2015-04-27 NOTE — Brief Op Note (Signed)
04/27/2015  2:26 PM  PATIENT:  Veronica Flynn  49 y.o. female  PRE-OPERATIVE DIAGNOSIS:  Degenerative Disc Disease  POST-OPERATIVE DIAGNOSIS:  Degenerative Disc Disease  PROCEDURE:  Procedure(s): Posterior Lumbar Interbody Fusion Lumbar five- Sacral One (N/A)  SURGEON:  Surgeon(s) and Role:    * Julio SicksHenry Maricarmen Braziel, MD - Primary    * Tressie StalkerJeffrey Jenkins, MD - Assisting  PHYSICIAN ASSISTANT:   ASSISTANTS:    ANESTHESIA:   general  EBL:  Total I/O In: 2100 [I.V.:1600; IV Piggyback:500] Out: 250 [Urine:150; Blood:100]  BLOOD ADMINISTERED:none  DRAINS: none   LOCAL MEDICATIONS USED:  MARCAINE     SPECIMEN:  No Specimen  DISPOSITION OF SPECIMEN:  N/A  COUNTS:  YES  TOURNIQUET:  * No tourniquets in log *  DICTATION: .Dragon Dictation  PLAN OF CARE: Admit to inpatient   PATIENT DISPOSITION:  PACU - hemodynamically stable.   Delay start of Pharmacological VTE agent (>24hrs) due to surgical blood loss or risk of bleeding: yes

## 2015-04-27 NOTE — Anesthesia Procedure Notes (Signed)
Procedure Name: Intubation Date/Time: 04/27/2015 12:22 PM Performed by: Brien MatesMAHONY, Kaylen Motl D Pre-anesthesia Checklist: Patient identified, Emergency Drugs available, Suction available, Patient being monitored and Timeout performed Patient Re-evaluated:Patient Re-evaluated prior to inductionOxygen Delivery Method: Circle system utilized Preoxygenation: Pre-oxygenation with 100% oxygen Intubation Type: IV induction Ventilation: Mask ventilation without difficulty Laryngoscope Size: Miller and 2 Grade View: Grade I Tube type: Oral Tube size: 7.5 mm Number of attempts: 1 Airway Equipment and Method: Stylet Placement Confirmation: ETT inserted through vocal cords under direct vision,  positive ETCO2 and breath sounds checked- equal and bilateral Secured at: 21 cm Tube secured with: Tape Dental Injury: Teeth and Oropharynx as per pre-operative assessment

## 2015-04-27 NOTE — Anesthesia Preprocedure Evaluation (Addendum)
Anesthesia Evaluation  Patient identified by MRN, date of birth, ID band Patient awake    Reviewed: Allergy & Precautions, NPO status , Patient's Chart, lab work & pertinent test results  History of Anesthesia Complications (+) PROLONGED EMERGENCE and history of anesthetic complications  Airway Mallampati: II       Dental no notable dental hx. (+) Teeth Intact   Pulmonary pneumonia, resolved, Current Smoker,    Pulmonary exam normal breath sounds clear to auscultation       Cardiovascular negative cardio ROS Normal cardiovascular exam Rhythm:Regular Rate:Normal     Neuro/Psych PSYCHIATRIC DISORDERS Depression negative neurological ROS     GI/Hepatic GERD  Medicated and Controlled,IBS   Endo/Other  Hypothyroidism Morbid obesity  Renal/GU   negative genitourinary   Musculoskeletal DDD lumbar spine   Abdominal (+) + obese,   Peds  Hematology   Anesthesia Other Findings   Reproductive/Obstetrics negative OB ROS                            Anesthesia Physical Anesthesia Plan  ASA: III  Anesthesia Plan: General   Post-op Pain Management:    Induction: Intravenous  Airway Management Planned: Oral ETT  Additional Equipment:   Intra-op Plan:   Post-operative Plan: Extubation in OR  Informed Consent: I have reviewed the patients History and Physical, chart, labs and discussed the procedure including the risks, benefits and alternatives for the proposed anesthesia with the patient or authorized representative who has indicated his/her understanding and acceptance.   Dental advisory given  Plan Discussed with: CRNA, Anesthesiologist and Surgeon  Anesthesia Plan Comments:         Anesthesia Quick Evaluation

## 2015-04-27 NOTE — Progress Notes (Signed)
Utilization review completed. Vann Okerlund, RN, BSN. 

## 2015-04-27 NOTE — Progress Notes (Signed)
ANTIBIOTIC CONSULT NOTE - INITIAL  Pharmacy Consult for Vancomycin Indication: post-op prophylaxis  Allergies  Allergen Reactions  . Penicillins Swelling and Rash    Has patient had a PCN reaction causing immediate rash, facial/tongue/throat swelling, SOB or lightheadedness with hypotension: * Yes*    . Sulfa Antibiotics Nausea And Vomiting  . Tramadol Nausea And Vomiting    constipation    Patient Measurements: Weight: 253 lb (114.76 kg)  Vital Signs: Temp: 97.5 F (36.4 C) (12/30 1517) Temp Source: Oral (12/30 0959) BP: 89/69 mmHg (12/30 1452) Pulse Rate: 93 (12/30 1517) Intake/Output from previous day:   Intake/Output from this shift: Total I/O In: 2100 [I.V.:1600; IV Piggyback:500] Out: 500 [Urine:300; Blood:200]  Labs:  Recent Labs  04/26/15 0834  WBC 6.2  HGB 14.2  PLT 253   CrCl cannot be calculated (Patient has no serum creatinine result on file.). No results for input(s): VANCOTROUGH, VANCOPEAK, VANCORANDOM, GENTTROUGH, GENTPEAK, GENTRANDOM, TOBRATROUGH, TOBRAPEAK, TOBRARND, AMIKACINPEAK, AMIKACINTROU, AMIKACIN in the last 72 hours.   Microbiology: Recent Results (from the past 720 hour(s))  Surgical pcr screen     Status: None   Collection Time: 04/26/15  8:34 AM  Result Value Ref Range Status   MRSA, PCR NEGATIVE NEGATIVE Final   Staphylococcus aureus NEGATIVE NEGATIVE Final    Comment:        The Xpert SA Assay (FDA approved for NASAL specimens in patients over 51 years of age), is one component of a comprehensive surveillance program.  Test performance has been validated by [facility] for patients greater than or equal to 51 year old. It is not intended to diagnose infection nor to guide or monitor treatment.     Medical History: Past Medical History  Diagnosis Date  . Hypothyroidism     takes Synthroid daily  . Depression     takes Paxil daily  . GERD (gastroesophageal reflux disease)     takes Protonix daily  . Complication of  anesthesia     slow to wake up  . Cough     nonproductive  . Pneumonia 6-7 yrs ago    hx of  . History of bronchitis 2015  . Joint pain     right elbow and right knee  . Joint swelling     right knee  . Chronic back pain     DDD  . Diarrhea   . History of colon polyps     benign  . IBS (irritable bowel syndrome)   . Stress incontinence   . Insomnia     doesn't take any meds    Medications:  Scheduled:  . fentaNYL      . gabapentin  600 mg Oral QHS  . [START ON 04/28/2015] levothyroxine  100 mcg Oral QAC breakfast  . meloxicam  15 mg Oral Daily  . [START ON 04/28/2015] pantoprazole  40 mg Oral Daily  . PARoxetine  25 mg Oral BID  . sodium chloride  3 mL Intravenous Q12H  . vancomycin       Assessment: 49 yo F s/p lumbar fusion surgery.  To receive Vancomycin x 1 dose for post-op prophylaxis.  Last dose given pre-op at 12 noon.   Goal of Therapy:  Vancomycin trough level 10-15 mcg/ml  Plan:  Vancomycin 1000mg  IV x 1 dose at midnight tonight. Rx will sign off.  No further doses since patient does not have drain in place.  Toys 'R' UsKimberly Myka Hitz, Pharm.D., BCPS Clinical Pharmacist Pager [phone] 04/27/2015 4:19 PM

## 2015-04-28 MED ORDER — DIAZEPAM 5 MG PO TABS: 5.0000 mg | ORAL_TABLET | Freq: Four times a day (QID) | ORAL | Status: DC | PRN

## 2015-04-28 MED ORDER — OXYCODONE-ACETAMINOPHEN 5-325 MG PO TABS: 1.0000 | ORAL_TABLET | ORAL | Status: DC | PRN

## 2015-04-28 NOTE — Discharge Summary (Signed)
Physician Discharge Summary  Patient ID: Veronica DuraKelly D Flynn MRN: [id number] DOB/AGE: 51/11/1965 51 y.o.  Admit date: 04/27/2015 Discharge date: 04/28/2015  Admission Diagnoses:  Discharge Diagnoses:  Active Problems:   DDD (degenerative disc disease), lumbar   Discharged Condition: good  Hospital Course: Patient admitted to the hospital where she underwent an uncomplicated L5-S1 decompression and fusion. Postoperative she is doing very well. Preoperative back and leg pain improved. Ambulating with aid of walker with minimal difficulty. Overall feels improved from preop and ready for discharge home.  Consults:   Significant Diagnostic Studies:   Treatments:   Discharge Exam: Blood pressure 108/71, pulse 102, temperature 99.1 F (37.3 C), temperature source Oral, resp. rate 20, height 5\' 7"  (1.702 m), weight 114 kg (251 lb 5.2 oz), SpO2 94 %. Awake and alert. Oriented and appropriate. Cranial nerve function intact. Motor and sensory function extremities normal. Chest and abdomen benign. Wound clean and dry.  Disposition: 01-Home or Self Care     Medication List    TAKE these medications        diazepam 5 MG tablet  Commonly known as:  VALIUM  Take 1-2 tablets (5-10 mg total) by mouth every 6 (six) hours as needed for muscle spasms.     gabapentin 300 MG capsule  Commonly known as:  NEURONTIN  Take 600 mg by mouth at bedtime.     levothyroxine 100 MCG tablet  Commonly known as:  SYNTHROID, LEVOTHROID  Take 100 mcg by mouth daily.     meloxicam 15 MG tablet  Commonly known as:  MOBIC  Take 15 mg by mouth daily.     oxyCODONE-acetaminophen 5-325 MG tablet  Commonly known as:  PERCOCET/ROXICET  Take 1-2 tablets by mouth every 4 (four) hours as needed for moderate pain.     pantoprazole 40 MG tablet  Commonly known as:  PROTONIX  Take 40 mg by mouth daily.     PARoxetine 25 MG 24 hr tablet  Commonly known as:  PAXIL-CR  Take 25 mg by mouth 2 (two) times daily.            Follow-up Information    Follow up with Temple PaciniPOOL,Robena Ewy A, MD.   Specialty:  Neurosurgery   Contact information:   [address] [place] [address] [phone]       Signed: Temple PaciniOOL,Boby Eyer A 04/28/2015, 11:50 AM

## 2015-04-28 NOTE — Evaluation (Addendum)
Occupational Therapy Evaluation Patient Details Name: Veronica Flynn MRN: [id number] DOB: 05/09/1965 Today's Date: 04/28/2015    History of Present Illness Pt presents with back pain for L5-S1 fusion, PMH: c-spine fusion   Clinical Impression   Pt s/p above. Education provided in session and pt verbalized understanding. OT signing off.    Follow Up Recommendations  No OT follow up;Supervision - Intermittent    Equipment Recommendations  None recommended by OT    Recommendations for Other Services       Precautions / Restrictions Precautions Precautions: Back Precaution Booklet Issued: No Precaution Comments: reviewed back precautions Required Braces or Orthoses: Spinal Brace Spinal Brace: Lumbar corset (already on in session-instructed to don/doff in sitting position) Restrictions Weight Bearing Restrictions: No      Mobility Bed Mobility  General bed mobility comments: verbalized understanding  Transfers Overall transfer level: Needs assistance Transfers: Sit to/from Stand Sit to Stand: Supervision         General transfer comment: cue for hand placement/technique. RW in front for sit to stand from chair. Stood from bed without RW. Transferred from bed and took a couple steps and turned to sit in chair with Supervision.    Balance No LOB in session.                          ADL Overall ADL's : Needs assistance/impaired                     Lower Body Dressing: Sit to/from stand;Set up;Supervision/safety   Toilet Transfer: Set up;Supervision/safety;Ambulation;RW (performed sit to stand from chair and bed)           Functional mobility during ADLs: Rolling walker;Set up;Supervision/safety (took a couple steps and turned to sit in chair without using RW-Supervision) General ADL Comments: Discussed back brace-Pt verbalized understanding of it. Educated on safety such as use of bag on walker and recommended someone be with her for shower  transfer. Educated on LB ADL technique and AE. Discussed what pt could use for toilet aide for hygiene. Discussed incorporating precautions into functional activities.      Vision     Perception     Praxis      Pertinent Vitals/Pain Pain Assessment: 0-10 Pain Score:  (5-6 ) Pain Location: back Pain Intervention(s): Monitored during session     Hand Dominance Right   Extremity/Trunk Assessment Upper Extremity Assessment Upper Extremity Assessment: Overall WFL for tasks assessed RUE Deficits / Details: fractured right forearm and elbow before Thanksgiving, no longer splinted   Lower Extremity Assessment Lower Extremity Assessment: Defer to PT evaluation   Cervical / Trunk Assessment Cervical / Trunk Assessment: Normal   Communication Communication Communication: No difficulties   Cognition Arousal/Alertness: Awake/alert Behavior During Therapy: WFL for tasks assessed/performed Overall Cognitive Status: Within Functional Limits for tasks assessed                     General Comments       Exercises       Shoulder Instructions      Home Living Family/patient expects to be discharged to:: Private residence Living Arrangements: Spouse/significant other Available Help at Discharge: Family;Available 24 hours/day Type of Home: House       Home Layout: Two level Alternate Level Stairs-Number of Steps: flight Alternate Level Stairs-Rails: Right Bathroom Shower/Tub: Walk-in shower         Home Equipment: Cane - single point (BSC and  RW-2 wheels had been delivered to room)   Additional Comments: pt's bedroom [place] upstairs, she has already been going up and down only once a day. 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    OT Diagnosis: Acute pain   OT Problem List:     OT Treatment/Interventions:      OT Goals(Current goals can be found in the care plan section)   OT Frequency:     Barriers to D/C:            Co-evaluation              End of Session Equipment Utilized During Treatment: Rolling walker;Back brace  Activity Tolerance: Patient tolerated treatment well Patient left: in chair;with call bell/phone within reach;with family/visitor present   Time: 1478-2956 OT Time Calculation (min): 15 min Charges:  OT General Charges $OT Visit: 1 Procedure OT Evaluation $Initial OT Evaluation Tier I: 1 Procedure G-CodesEarlie Raveling OTR/L [id number] 04/28/2015, 1:50 PM

## 2015-04-28 NOTE — Discharge Instructions (Signed)

## 2015-04-28 NOTE — Evaluation (Signed)
Physical Therapy Evaluation Patient Details Name: Veronica Flynn D Mckellips MRN: [id number] DOB: 09/20/1965 Today's Date: 04/28/2015   History of Present Illness  Pt presents with back pain for L5-S1 fusion, PMH: c-spine fusion  Clinical Impression  Pt admitted with above diagnosis. Pt currently with functional limitations due to the deficits listed below (see PT Problem List). Pt ambulated 200' with RW and supervision. Discussed stairs at home as well as car transfer for d/c and activity level upon return home. Pt will benefit from skilled PT to increase their independence and safety with mobility to allow discharge to the venue listed below.       Follow Up Recommendations Home health PT    Equipment Recommendations  Rolling walker with 5" wheels    Recommendations for Other Services       Precautions / Restrictions Precautions Precautions: Back Precaution Booklet Issued: Yes (comment) Precaution Comments: reviewed back precautions and implications with function Required Braces or Orthoses: Spinal Brace Spinal Brace: Lumbar corset;Applied in sitting position Restrictions Weight Bearing Restrictions: No      Mobility  Bed Mobility Overal bed mobility: Needs Assistance Bed Mobility: Rolling;Sidelying to Sit Rolling: Supervision Sidelying to sit: Supervision       General bed mobility comments: pt able to get to side of bed without physical assist but needed vc's for technique to keep precautions  Transfers Overall transfer level: Needs assistance Equipment used: Rolling walker (2 wheeled) Transfers: Sit to/from Stand Sit to Stand: Supervision         General transfer comment: supervision for safety and vc's for hand placement  Ambulation/Gait Ambulation/Gait assistance: Supervision Ambulation Distance (Feet): 200 Feet Assistive device: Rolling walker (2 wheeled) Gait Pattern/deviations: Decreased stride length;Step-through pattern;Trunk flexed Gait velocity:  decreased Gait velocity interpretation: Below normal speed for age/gender General Gait Details: vc's several times for erect posture.   Stairs            Wheelchair Mobility    Modified Rankin (Stroke Patients Only)       Balance Overall balance assessment: Needs assistance Sitting-balance support: No upper extremity supported Sitting balance-Leahy Scale: Good     Standing balance support: Single extremity supported Standing balance-Leahy Scale: Fair Standing balance comment: pt able to stand in static position without support but needs UE support for safety with dynamic activity                             Pertinent Vitals/Pain Pain Assessment: 0-10 Pain Score: 6  Pain Location: back Pain Descriptors / Indicators: Aching Pain Intervention(s): Limited activity within patient's tolerance;Monitored during session;Premedicated before session        VSS  Home Living Family/patient expects to be discharged to:: Private residence Living Arrangements: Spouse/significant other Available Help at Discharge: Family;Available 24 hours/day Type of Home: House       Home Layout: Two level Home Equipment: Cane - single point Additional Comments: pt's bedroom [place] upstairs, she has already been going up and down only once a day. 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 Discussed the challenges that that job will pose after this surgery     Hand Dominance        Extremity/Trunk Assessment  Upper Extremity Assessment: RUE deficits/detail RUE Deficits / Details: fractured right forearm and elbow before Thanksgiving, no longer splinted         Lower Extremity Assessment: Overall WFL for tasks assessed      Cervical / Trunk  Assessment: Normal  Communication   Communication: No difficulties  Cognition Arousal/Alertness: Awake/alert Behavior During Therapy: WFL for tasks assessed/performed Overall Cognitive Status: Within Functional Limits for tasks assessed                      General Comments General comments (skin integrity, edema, etc.): discussed car transfer and activity level upon return home    Exercises        Assessment/Plan    PT Assessment Patient needs continued PT services  PT Diagnosis Difficulty walking;Acute pain   PT Problem List Decreased activity tolerance;Decreased balance;Decreased mobility;Decreased knowledge of precautions;Decreased knowledge of use of DME;Pain  PT Treatment Interventions DME instruction;Gait training;Stair training;Functional mobility training;Therapeutic activities;Therapeutic exercise;Balance training;Patient/family education   PT Goals (Current goals can be found in the Care Plan section) Acute Rehab PT Goals Patient Stated Goal: return home PT Goal Formulation: With patient Time For Goal Achievement: 05/05/15 Potential to Achieve Goals: Good    Frequency Min 5X/week   Barriers to discharge        Co-evaluation               End of Session Equipment Utilized During Treatment: Gait belt;Back brace Activity Tolerance: Patient tolerated treatment well Patient left: in chair;with call bell/phone within reach;with family/visitor present Nurse Communication: Mobility status         Time: 1120-1145 PT Time Calculation (min) (ACUTE ONLY): 25 min   Charges:   PT Evaluation $Initial PT Evaluation Tier I: 1 Procedure PT Treatments $Gait Training: 8-22 mins   PT G Codes:       Lyanne Co, PT  Acute Rehab Services  [phone]  Lyanne Co 04/28/2015, 12:48 PM

## 2015-04-28 NOTE — Progress Notes (Signed)
Patient ready for discharge to home; discharge instructions given and reviewed;Rx's given; equipment delivered; ready for discharge with her spouse.  Discharged out via wheelchair accompanied by her husband.

## 2015-04-28 NOTE — Care Management Note (Signed)
Case Management Note  Patient Details  Name: Tacy DuraKelly D Sunde MRN: [id number] Date of Birth: 02/07/1966  Subjective/Objective:                    Action/Plan: Anticipate discharge home today [facility] R[facility] . No further CM needs but will be available should additional discharge needs arise.   Expected Discharge Date:                  Expected Discharge Plan:  Home/Self Care  In-House Referral:     Discharge planning Services  CM Consult  Post Acute Care Choice:  Durable Medical Equipment Choice offered to:  Patient  DME Arranged:  3-N-1, Walker rolling DME Agency:  [facility]  HH Arranged:    HH Agency:     Status of Service:  Completed, signed off  Medicare Important Message Given:    Date Medicare IM Given:    Medicare IM give by:    Date Additional Medicare IM Given:    Additional Medicare Important Message give by:     If discussed at Long Length of Stay Meetings, dates discussed:    Additional Comments:  Yvone NeuCrutchfield, Aleczander Fandino M, RN 04/28/2015, 12:47 PM

## 2015-04-30 NOTE — Anesthesia Postprocedure Evaluation (Signed)
Anesthesia Post Note  Patient: Tacy DuraKelly D Conway  Procedure(s) Performed: Procedure(s) (LRB): Posterior Lumbar Interbody Fusion Lumbar five- Sacral One (N/A)  Patient location during evaluation: PACU Anesthesia Type: General Level of consciousness: awake and alert and oriented Pain management: pain level controlled Vital Signs Assessment: post-procedure vital signs reviewed and stable Respiratory status: spontaneous breathing, nonlabored ventilation, respiratory function stable and patient connected to nasal cannula oxygen Cardiovascular status: blood pressure returned to baseline Postop Assessment: patient able to bend at knees and no signs of nausea or vomiting Anesthetic complications: no           Isis Costanza A.

## 2015-06-12 ENCOUNTER — Encounter: Payer: Self-pay | Admitting: Emergency Medicine

## 2015-06-12 ENCOUNTER — Emergency Department: Payer: 59

## 2015-06-12 ENCOUNTER — Emergency Department
Admission: EM | Admit: 2015-06-12 | Discharge: 2015-06-12 | Disposition: A | Discharge status: Home / Self Care | Payer: 59 | Attending: Emergency Medicine | Admitting: Emergency Medicine

## 2015-06-12 DIAGNOSIS — Y9389 Activity, other specified: Secondary | ICD-10-CM | POA: Insufficient documentation

## 2015-06-12 DIAGNOSIS — S86911A Strain of unspecified muscle(s) and tendon(s) at lower leg level, right leg, initial encounter: Secondary | ICD-10-CM | POA: Insufficient documentation

## 2015-06-12 DIAGNOSIS — Z79899 Other long term (current) drug therapy: Secondary | ICD-10-CM | POA: Diagnosis not present

## 2015-06-12 DIAGNOSIS — Z791 Long term (current) use of non-steroidal anti-inflammatories (NSAID): Secondary | ICD-10-CM | POA: Insufficient documentation

## 2015-06-12 DIAGNOSIS — W108XXA Fall (on) (from) other stairs and steps, initial encounter: Secondary | ICD-10-CM | POA: Diagnosis not present

## 2015-06-12 DIAGNOSIS — Z880 Allergy status to penicillin: Secondary | ICD-10-CM | POA: Insufficient documentation

## 2015-06-12 DIAGNOSIS — Y998 Other external cause status: Secondary | ICD-10-CM | POA: Insufficient documentation

## 2015-06-12 DIAGNOSIS — S8391XA Sprain of unspecified site of right knee, initial encounter: Secondary | ICD-10-CM | POA: Insufficient documentation

## 2015-06-12 DIAGNOSIS — Y9289 Other specified places as the place of occurrence of the external cause: Secondary | ICD-10-CM | POA: Insufficient documentation

## 2015-06-12 DIAGNOSIS — S8991XA Unspecified injury of right lower leg, initial encounter: Secondary | ICD-10-CM | POA: Diagnosis present

## 2015-06-12 DIAGNOSIS — F17210 Nicotine dependence, cigarettes, uncomplicated: Secondary | ICD-10-CM | POA: Insufficient documentation

## 2015-06-12 MED ORDER — IBUPROFEN 800 MG PO TABS: 800.0000 mg | ORAL_TABLET | Freq: Three times a day (TID) | ORAL | Status: DC | PRN

## 2015-06-12 MED ORDER — OXYCODONE-ACETAMINOPHEN 5-325 MG PO TABS
1.0000 | ORAL_TABLET | Freq: Four times a day (QID) | ORAL | Status: DC | PRN
Start: 2015-06-12 — End: 2020-01-12

## 2015-06-12 NOTE — ED Notes (Signed)
Pt has right knee pain.  Injured 3 days ago.  Pt did not fall.  Pt states she stepped out on the patio and lost balance and put weight on right knee.  Pt had back surgery 12/16

## 2015-06-12 NOTE — ED Notes (Signed)
Pt is on percocet and flexeril for back pain, takes daily.

## 2015-06-12 NOTE — ED Notes (Signed)
Pt was trying to keep herself from falling a few days ago, didn't fall, however, her right knee has been hurting ever since, states she is unable to put pressure on it.

## 2015-06-12 NOTE — ED Provider Notes (Signed)
[facility] Emergency Department Provider Note  ____________________________________________  Time seen: Approximately 7:39 PM  I have reviewed the triage vital signs and the nursing notes.   HISTORY  Chief Complaint Knee Pain    HPI Veronica Flynn is a 50 y.o. female with history of back surgery on December 30 who lost her balance going down stairs 3 days ago and injuring her right knee. She has had increasing swelling and pain to the knee. No prior history of knee problems. No redness. She continues on Percocet for her back pain.   Past Medical History  Diagnosis Date  . Hypothyroidism     takes Synthroid daily  . Depression     takes Paxil daily  . GERD (gastroesophageal reflux disease)     takes Protonix daily  . Complication of anesthesia     slow to wake up  . Cough     nonproductive  . Pneumonia 6-7 yrs ago    hx of  . History of bronchitis 2015  . Joint pain     right elbow and right knee  . Joint swelling     right knee  . Chronic back pain     DDD  . Diarrhea   . History of colon polyps     benign  . IBS (irritable bowel syndrome)   . Stress incontinence   . Insomnia     doesn't take any meds    Patient Active Problem List   Diagnosis Date Noted  . DDD (degenerative disc disease), lumbar 04/27/2015    Past Surgical History  Procedure Laterality Date  . Cervical spine surgery      fusion  . Abdominal hysterectomy    . Eye surgery Left     as a child  . Colonoscopy    . Esophagogastroduodenoscopy    . Cholecystectomy      Current Outpatient Rx  Name  Route  Sig  Dispense  Refill  . diazepam (VALIUM) 5 MG tablet   Oral   Take 1-2 tablets (5-10 mg total) by mouth every 6 (six) hours as needed for muscle spasms.   60 tablet   0   . gabapentin (NEURONTIN) 300 MG capsule   Oral   Take 600 mg by mouth at bedtime.         Marland Kitchen ibuprofen (ADVIL,MOTRIN) 800 MG tablet   Oral   Take 1 tablet (800 mg total) by mouth  every 8 (eight) hours as needed.   15 tablet   0   . levothyroxine (SYNTHROID, LEVOTHROID) 100 MCG tablet   Oral   Take 100 mcg by mouth daily.         . meloxicam (MOBIC) 15 MG tablet   Oral   Take 15 mg by mouth daily.         Marland Kitchen oxyCODONE-acetaminophen (PERCOCET/ROXICET) 5-325 MG tablet   Oral   Take 1-2 tablets by mouth every 4 (four) hours as needed for moderate pain.   90 tablet   0   . oxyCODONE-acetaminophen (ROXICET) 5-325 MG tablet   Oral   Take 1 tablet by mouth every 6 (six) hours as needed.   20 tablet   0   . pantoprazole (PROTONIX) 40 MG tablet   Oral   Take 40 mg by mouth daily.         Marland Kitchen PARoxetine (PAXIL-CR) 25 MG 24 hr tablet   Oral   Take 25 mg by mouth 2 (two) times daily.  Allergies Penicillins; Sulfa antibiotics; and Tramadol  No family history on file.  Social History Social History  Substance Use Topics  . Smoking status: Current Every Day Smoker -- 0.50 packs/day for 46 years    Types: Cigarettes  . Smokeless tobacco: None  . Alcohol Use: No    Review of Systems Constitutional: No fever/chills Eyes: No visual changes. ENT: No sore throat. Cardiovascular: Denies chest pain. Respiratory: Denies shortness of breath. Gastrointestinal: No abdominal pain.  No nausea, no vomiting.  No diarrhea.  No constipation. Genitourinary: Negative for dysuria. Musculoskeletal:  Skin: Negative for rash. Neurological: Negative for headaches, focal weakness or numbness. 10-point ROS otherwise negative.  ____________________________________________   PHYSICAL EXAM:  VITAL SIGNS: ED Triage Vitals  Enc Vitals Group     BP 06/12/15 1822 108/69 mmHg     Pulse Rate 06/12/15 1819 81     Resp 06/12/15 1819 18     Temp 06/12/15 1819 98.7 F (37.1 C)     Temp Source 06/12/15 1819 Oral     SpO2 06/12/15 1819 95 %     Weight 06/12/15 1818 246 lb (111.585 kg)     Height 06/12/15 1818 5\' 7"  (1.702 m)     Head Cir --      Peak Flow  --      Pain Score 06/12/15 1818 10     Pain Loc --      Pain Edu? --      Excl. in GC? --     Constitutional: Alert and oriented. Well appearing and in no acute distress. Eyes: Conjunctivae are normal. P Cardiovascular: Normal rate, regular rhythm. Grossly normal heart sounds.  Good peripheral circulation. Gastrointestinal: Soft and nontender. No distention. No abdominal bruits. No CVA tenderness. Musculoskeletal: Nml ROM of upper and lower extremity joints. Neurologic: Limited range of motion due to pain and swelling. Negative Lachman's test. Minimal laxity with valgus and varus strain. Tender over the medial joint line. Effusion present. Skin:  Skin is warm, dry and intact. No rash noted. Psychiatric: Mood and affect are normal. Speech and behavior are normal.  ____________________________________________   LABS (all labs ordered are listed, but only abnormal results are displayed)  Labs Reviewed - No data to display ____________________________________________  EKG   ____________________________________________  RADIOLOGY   CLINICAL DATA: Status post almost falling a few days ago with persistent right knee pain since.  EXAM: RIGHT KNEE - COMPLETE 4+ VIEW  COMPARISON: None.  FINDINGS: There is no evidence of fracture, dislocation. There is a suprapatellar effusion. There are mild degenerative joint changes of the knee Soft tissues are unremarkable.  IMPRESSION: No acute fracture or dislocation. Mild degenerative joint changes of the knee.   Electronically Signed  By: Sherian Rein M.D.  On: 06/12/2015 19:36       ____________________________________________   PROCEDURES  Procedure(s) performed: None  Critical Care performed: No  ____________________________________________   INITIAL IMPRESSION / ASSESSMENT AND PLAN / ED COURSE  Pertinent labs & imaging results that were available during my care of the patient were reviewed by me  and considered in my medical decision making (see chart for details).  51 year old with right knee injury concerning for ligament strain. She has a brace at home as well as crutches and a walker. She will continue ice. She is given ibuprofen and Percocet for pain control. She will contact orthopedics this week to set up a follow-up appointment. ____________________________________________   FINAL CLINICAL IMPRESSION(S) / ED DIAGNOSES  Final diagnoses:  Knee  sprain and strain, right, initial encounter      Ignacia Bayley, PA-C 06/12/15 1957  Sharman Cheek, MD 06/12/15 2316

## 2015-06-12 NOTE — Discharge Instructions (Signed)
Knee Sprain A knee sprain is a tear in one of the strong, fibrous tissues that connect the bones (ligaments) in your knee. The severity of the sprain depends on how much of the ligament is torn. The tear can be either partial or complete. CAUSES  Often, sprains are a result of a fall or injury. The force of the impact causes the fibers of your ligament to stretch too much. This excess tension causes the fibers of your ligament to tear. SIGNS AND SYMPTOMS  You may have some loss of motion in your knee. Other symptoms include:  Bruising.  Pain in the knee area.  Tenderness of the knee to the touch.  Swelling. DIAGNOSIS  To diagnose a knee sprain, your health care provider will physically examine your knee. Your health care provider may also suggest an X-ray exam of your knee to make sure no bones are broken. TREATMENT  If your ligament is only partially torn, treatment usually involves keeping the knee in a fixed position (immobilization) or bracing your knee for activities that require movement for several weeks. To do this, your health care provider will apply a bandage, cast, or splint to keep your knee from moving and to support your knee during movement until it heals. For a partially torn ligament, the healing process usually takes 4-6 weeks. If your ligament is completely torn, depending on which ligament it is, you may need surgery to reconnect the ligament to the bone or reconstruct it. After surgery, a cast or splint may be applied and will need to stay on your knee for 4-6 weeks while your ligament heals. HOME CARE INSTRUCTIONS  Keep your injured knee elevated to decrease swelling.  To ease pain and swelling, apply ice to the injured area:  Put ice in a plastic bag.  Place a towel between your skin and the bag.  Leave the ice on for 20 minutes, 2-3 times a day.  Only take medicine for pain as directed by your health care provider.  Do not leave your knee unprotected until  pain and stiffness go away (usually 4-6 weeks).  If you have a cast or splint, do not allow it to get wet. If you have been instructed not to remove it, cover it with a plastic bag when you shower or bathe. Do not swim.  Your health care provider may suggest exercises for you to do during your recovery to prevent or limit permanent weakness and stiffness. SEEK IMMEDIATE MEDICAL CARE IF:  Your cast or splint becomes damaged.  Your pain becomes worse.  You have significant pain, swelling, or numbness below the cast or splint. MAKE SURE YOU:  Understand these instructions.  Will watch your condition.  Will get help right away if you are not doing well or get worse.   This information is not intended to replace advice given to you by your health care provider. Make sure you discuss any questions you have with your health care provider.   Document Released: 04/14/2005 Document Revised: 05/05/2014 Document Reviewed: 11/24/2012 Elsevier Interactive Patient Education 2016 [employer].   Continued pain medicine as directed. Use a brace for support to the right knee. Continue use of a walker and contact the orthopedist this week to set up a follow-up appointment.

## 2015-06-21 ENCOUNTER — Other Ambulatory Visit: Payer: Self-pay | Admitting: Orthopedic Surgery

## 2015-06-21 DIAGNOSIS — M25561 Pain in right knee: Secondary | ICD-10-CM

## 2015-07-11 ENCOUNTER — Ambulatory Visit
Admission: RE | Admit: 2015-07-11 | Discharge: 2015-07-11 | Disposition: A | Discharge status: Home / Self Care | Payer: 59 | Source: Ambulatory Visit | Attending: Orthopedic Surgery | Admitting: Orthopedic Surgery

## 2015-07-11 DIAGNOSIS — M25561 Pain in right knee: Secondary | ICD-10-CM | POA: Insufficient documentation

## 2015-07-11 DIAGNOSIS — M84362A Stress fracture, left tibia, initial encounter for fracture: Secondary | ICD-10-CM | POA: Diagnosis not present

## 2015-07-11 DIAGNOSIS — M65861 Other synovitis and tenosynovitis, right lower leg: Secondary | ICD-10-CM | POA: Diagnosis not present

## 2015-07-11 DIAGNOSIS — S83231A Complex tear of medial meniscus, current injury, right knee, initial encounter: Secondary | ICD-10-CM | POA: Insufficient documentation

## 2015-07-11 DIAGNOSIS — M25461 Effusion, right knee: Secondary | ICD-10-CM | POA: Insufficient documentation

## 2015-07-11 DIAGNOSIS — M1711 Unilateral primary osteoarthritis, right knee: Secondary | ICD-10-CM | POA: Diagnosis not present

## 2015-07-20 ENCOUNTER — Inpatient Hospital Stay: Admission: RE | Admit: 2015-07-20 | Payer: 59 | Source: Ambulatory Visit

## 2015-07-20 ENCOUNTER — Encounter: Payer: Self-pay | Admitting: *Deleted

## 2015-07-20 NOTE — Patient Instructions (Signed)
  Your procedure is scheduled on: 07-26-15 Report to [facility] 2ND FLOOR To find out your arrival time please call [phone] between 1PM - 3PM on 07-25-15   Remember: Instructions that are not followed completely may result in serious medical risk, up to and including death, or upon the discretion of your surgeon and anesthesiologist your surgery may need to be rescheduled.    _X___ 1. Do not eat food or drink liquids after midnight. No gum chewing or hard candies.     _X___ 2. No Alcohol for 24 hours before or after surgery.   ____ 3. Bring all medications with you on the day of surgery if instructed.    ____ 4. Notify your doctor if there is any change in your medical condition     (cold, fever, infections).     Do not wear jewelry, make-up, hairpins, clips or nail polish.  Do not wear lotions, powders, or perfumes. You may wear deodorant.  Do not shave 48 hours prior to surgery. Men may shave face and neck.  Do not bring valuables to the hospital.    [facility] is not responsible for any belongings or valuables.               Contacts, dentures or bridgework may not be worn into surgery.  Leave your suitcase in the car. After surgery it may be brought to your room.  For patients admitted to the hospital, discharge time is determined by your treatment team.   Patients discharged the day of surgery will not be allowed to drive home.   Please read over the following fact sheets that you were given:      _X___ Take these medicines the morning of surgery with A SIP OF WATER:    1. LEVOTHYROXINE  2. PROTONIX  3. TAKE AN EXTRA PROTONIX ON Wednesday NIGHT  4.  5.  6.  ____ Fleet Enema (as directed)   ____ Use CHG Soap as directed  ____ Use inhalers on the day of surgery  ____ Stop metformin 2 days prior to surgery    ____ Take 1/2 of usual insulin dose the night before surgery and none on the morning of surgery.   ____ Stop  Coumadin/Plavix/aspirin-N/A  ____ Stop Anti-inflammatories-NO NSAIDS OR ASA PRODUCTS-PERCOCET OK TO CONTINUE   ____ Stop supplements until after surgery.    ____ Bring C-Pap to the hospital.

## 2015-07-26 ENCOUNTER — Ambulatory Visit
Admission: RE | Admit: 2015-07-26 | Discharge: 2015-07-26 | Disposition: A | Discharge status: Home / Self Care | Payer: 59 | Source: Ambulatory Visit | Attending: Orthopedic Surgery | Admitting: Orthopedic Surgery

## 2015-07-26 ENCOUNTER — Ambulatory Visit: Payer: 59 | Admitting: Anesthesiology

## 2015-07-26 ENCOUNTER — Encounter: Payer: Self-pay | Admitting: *Deleted

## 2015-07-26 ENCOUNTER — Ambulatory Visit: Payer: 59

## 2015-07-26 ENCOUNTER — Encounter
Admission: RE | Disposition: A | Discharge status: Home / Self Care | Payer: Self-pay | Source: Ambulatory Visit | Attending: Orthopedic Surgery

## 2015-07-26 DIAGNOSIS — Z880 Allergy status to penicillin: Secondary | ICD-10-CM | POA: Diagnosis not present

## 2015-07-26 DIAGNOSIS — Z882 Allergy status to sulfonamides status: Secondary | ICD-10-CM | POA: Diagnosis not present

## 2015-07-26 DIAGNOSIS — M84361A Stress fracture, right tibia, initial encounter for fracture: Secondary | ICD-10-CM | POA: Insufficient documentation

## 2015-07-26 DIAGNOSIS — K219 Gastro-esophageal reflux disease without esophagitis: Secondary | ICD-10-CM | POA: Diagnosis not present

## 2015-07-26 DIAGNOSIS — Z79899 Other long term (current) drug therapy: Secondary | ICD-10-CM | POA: Insufficient documentation

## 2015-07-26 DIAGNOSIS — Z888 Allergy status to other drugs, medicaments and biological substances status: Secondary | ICD-10-CM | POA: Insufficient documentation

## 2015-07-26 DIAGNOSIS — Y939 Activity, unspecified: Secondary | ICD-10-CM | POA: Insufficient documentation

## 2015-07-26 DIAGNOSIS — X58XXXA Exposure to other specified factors, initial encounter: Secondary | ICD-10-CM | POA: Insufficient documentation

## 2015-07-26 DIAGNOSIS — F17200 Nicotine dependence, unspecified, uncomplicated: Secondary | ICD-10-CM | POA: Diagnosis not present

## 2015-07-26 DIAGNOSIS — Z811 Family history of alcohol abuse and dependence: Secondary | ICD-10-CM | POA: Insufficient documentation

## 2015-07-26 DIAGNOSIS — Z90710 Acquired absence of both cervix and uterus: Secondary | ICD-10-CM | POA: Diagnosis not present

## 2015-07-26 DIAGNOSIS — Z86010 Personal history of colonic polyps: Secondary | ICD-10-CM | POA: Diagnosis not present

## 2015-07-26 DIAGNOSIS — F329 Major depressive disorder, single episode, unspecified: Secondary | ICD-10-CM | POA: Insufficient documentation

## 2015-07-26 DIAGNOSIS — K529 Noninfective gastroenteritis and colitis, unspecified: Secondary | ICD-10-CM | POA: Diagnosis not present

## 2015-07-26 DIAGNOSIS — M6751 Plica syndrome, right knee: Secondary | ICD-10-CM | POA: Diagnosis not present

## 2015-07-26 DIAGNOSIS — M1711 Unilateral primary osteoarthritis, right knee: Secondary | ICD-10-CM | POA: Insufficient documentation

## 2015-07-26 DIAGNOSIS — Z419 Encounter for procedure for purposes other than remedying health state, unspecified: Secondary | ICD-10-CM

## 2015-07-26 DIAGNOSIS — Z8349 Family history of other endocrine, nutritional and metabolic diseases: Secondary | ICD-10-CM | POA: Diagnosis not present

## 2015-07-26 DIAGNOSIS — E039 Hypothyroidism, unspecified: Secondary | ICD-10-CM | POA: Insufficient documentation

## 2015-07-26 DIAGNOSIS — Z9049 Acquired absence of other specified parts of digestive tract: Secondary | ICD-10-CM | POA: Diagnosis not present

## 2015-07-26 HISTORY — DX: Unspecified osteoarthritis, unspecified site: M19.90

## 2015-07-26 HISTORY — PX: KNEE ARTHROSCOPY: SHX127

## 2015-07-26 SURGERY — ARTHROSCOPY, KNEE
Anesthesia: General | Laterality: Right | Wound class: Clean

## 2015-07-26 MED ORDER — ACETAMINOPHEN 10 MG/ML IV SOLN
INTRAVENOUS | Status: DC | PRN
  Administered 2015-07-26: 1000 mg via INTRAVENOUS

## 2015-07-26 MED ORDER — FENTANYL CITRATE (PF) 100 MCG/2ML IJ SOLN
INTRAMUSCULAR | Status: AC
  Administered 2015-07-26: 25 ug via INTRAVENOUS
  Filled 2015-07-26: qty 2

## 2015-07-26 MED ORDER — DEXAMETHASONE SODIUM PHOSPHATE 10 MG/ML IJ SOLN
INTRAMUSCULAR | Status: DC | PRN
  Administered 2015-07-26: 10 mg via INTRAVENOUS

## 2015-07-26 MED ORDER — CLINDAMYCIN PHOSPHATE 900 MG/50ML IV SOLN: 900.0000 mg | Freq: Once | INTRAVENOUS | Status: DC

## 2015-07-26 MED ORDER — ONDANSETRON HCL 4 MG/2ML IJ SOLN
INTRAMUSCULAR | Status: DC | PRN
  Administered 2015-07-26: 4 mg via INTRAVENOUS

## 2015-07-26 MED ORDER — LACTATED RINGERS IV SOLN
INTRAVENOUS | Status: DC
  Administered 2015-07-26: 07:00:00 via INTRAVENOUS

## 2015-07-26 MED ORDER — BUPIVACAINE-EPINEPHRINE (PF) 0.5% -1:200000 IJ SOLN
INTRAMUSCULAR | Status: AC
  Filled 2015-07-26: qty 30

## 2015-07-26 MED ORDER — OXYCODONE-ACETAMINOPHEN 7.5-325 MG PO TABS: 1.0000 | ORAL_TABLET | ORAL | Status: DC | PRN

## 2015-07-26 MED ORDER — CLINDAMYCIN PHOSPHATE 900 MG/50ML IV SOLN
INTRAVENOUS | Status: AC
Start: 2015-07-26 — End: 2015-07-26
  Administered 2015-07-26: 900 mg via INTRAVENOUS
  Filled 2015-07-26: qty 50

## 2015-07-26 MED ORDER — BUPIVACAINE-EPINEPHRINE (PF) 0.5% -1:200000 IJ SOLN
INTRAMUSCULAR | Status: DC | PRN
  Administered 2015-07-26: 30 mL via PERINEURAL

## 2015-07-26 MED ORDER — ONDANSETRON HCL 4 MG/2ML IJ SOLN: 4.0000 mg | Freq: Once | INTRAMUSCULAR | Status: DC | PRN

## 2015-07-26 MED ORDER — ACETAMINOPHEN 10 MG/ML IV SOLN
INTRAVENOUS | Status: AC
  Filled 2015-07-26: qty 100

## 2015-07-26 MED ORDER — FENTANYL CITRATE (PF) 100 MCG/2ML IJ SOLN
25.0000 ug | INTRAMUSCULAR | Status: DC | PRN
  Administered 2015-07-26 (×2): 25 ug via INTRAVENOUS

## 2015-07-26 MED ORDER — OXYCODONE-ACETAMINOPHEN 7.5-325 MG PO TABS
ORAL_TABLET | ORAL | Status: AC
  Administered 2015-07-26: 1
  Filled 2015-07-26: qty 1

## 2015-07-26 MED ORDER — KETOROLAC TROMETHAMINE 30 MG/ML IJ SOLN
INTRAMUSCULAR | Status: DC | PRN
  Administered 2015-07-26: 30 mg via INTRAVENOUS

## 2015-07-26 MED ORDER — PROPOFOL 10 MG/ML IV BOLUS
INTRAVENOUS | Status: DC | PRN
  Administered 2015-07-26: 150 mg via INTRAVENOUS
  Administered 2015-07-26: 30 mg via INTRAVENOUS

## 2015-07-26 MED ORDER — LIDOCAINE HCL (CARDIAC) 20 MG/ML IV SOLN
INTRAVENOUS | Status: DC | PRN
  Administered 2015-07-26: 100 mg via INTRAVENOUS

## 2015-07-26 MED ORDER — FENTANYL CITRATE (PF) 100 MCG/2ML IJ SOLN
INTRAMUSCULAR | Status: DC | PRN
  Administered 2015-07-26: 50 ug via INTRAVENOUS
  Administered 2015-07-26 (×2): 25 ug via INTRAVENOUS

## 2015-07-26 SURGICAL SUPPLY — 29 items
BANDAGE ACE 4X5 VEL STRL LF (GAUZE/BANDAGES/DRESSINGS) ×2 IMPLANT
BANDAGE ELASTIC 4 LF NS (GAUZE/BANDAGES/DRESSINGS) ×2 IMPLANT
BLADE FULL RADIUS 3.5 (BLADE) IMPLANT
BLADE INCISOR PLUS 4.5 (BLADE) IMPLANT
BLADE SHAVER 4.5 DBL SERAT CV (CUTTER) IMPLANT
BLADE SHAVER 4.5X7 STR FR (MISCELLANEOUS) IMPLANT
CHLORAPREP W/TINT 26ML (MISCELLANEOUS) ×2 IMPLANT
CUTTER AGGRESSIVE+ 3.5 (CUTTER) IMPLANT
GAUZE SPONGE 4X4 12PLY STRL (GAUZE/BANDAGES/DRESSINGS) ×2 IMPLANT
GLOVE SURG ORTHO 9.0 STRL STRW (GLOVE) ×2 IMPLANT
GOWN STRL REUS W/ TWL LRG LVL3 (GOWN DISPOSABLE) ×1 IMPLANT
GOWN STRL REUS W/TWL LRG LVL3 (GOWN DISPOSABLE) ×1
GOWN SURG XXL (GOWNS) ×2 IMPLANT
IV LACTATED RINGER IRRG 3000ML (IV SOLUTION) ×2
IV LR IRRIG 3000ML ARTHROMATIC (IV SOLUTION) ×2 IMPLANT
KIT MIXER ACCUMIX (KITS) ×2 IMPLANT
KIT RM TURNOVER STRD PROC AR (KITS) ×2 IMPLANT
KNEE KIT SCP W/SIDE ACCUPORT (Joint) ×2 IMPLANT
MANIFOLD NEPTUNE II (INSTRUMENTS) ×2 IMPLANT
PACK ARTHROSCOPY KNEE (MISCELLANEOUS) ×2 IMPLANT
SET TUBE SUCT SHAVER OUTFL 24K (TUBING) ×2 IMPLANT
SET TUBE TIP INTRA-ARTICULAR (MISCELLANEOUS) ×2 IMPLANT
SIDE DELIVERY CANNULA ×2 IMPLANT
SUT ETHILON 4-0 (SUTURE) ×1
SUT ETHILON 4-0 FS2 18XMFL BLK (SUTURE) ×1
SUTURE ETHLN 4-0 FS2 18XMF BLK (SUTURE) ×1 IMPLANT
TUBING ARTHRO INFLOW-ONLY STRL (TUBING) ×2 IMPLANT
WAND HAND CNTRL MULTIVAC 50 (MISCELLANEOUS) ×2 IMPLANT
accufill injectable calcium phosphate ×2 IMPLANT

## 2015-07-26 NOTE — H&P (Signed)
Reviewed paper H+P, will be scanned into chart. No changes noted.  

## 2015-07-26 NOTE — Anesthesia Postprocedure Evaluation (Signed)
Anesthesia Post Note  Patient: Veronica Flynn  Procedure(s) Performed: Procedure(s) (LRB): right knee arthroscopy, arthroscopic removal of plica, partial synovectomy,subchondroplasty (Right)  Patient location during evaluation: PACU Anesthesia Type: General Level of consciousness: awake and alert Pain management: pain level controlled Vital Signs Assessment: post-procedure vital signs reviewed and stable Respiratory status: spontaneous breathing, nonlabored ventilation, respiratory function stable and patient connected to nasal cannula oxygen Cardiovascular status: blood pressure returned to baseline and stable Postop Assessment: no signs of nausea or vomiting Anesthetic complications: no    Last Vitals:  Filed Vitals:   07/26/15 1037 07/26/15 1059  BP: 98/66 115/75  Pulse: 76 80  Temp:    Resp: 12 14    Last Pain:  Filed Vitals:   07/26/15 1107  PainSc: 2                  Yevette EdwardsJames G Pharrell Ledford

## 2015-07-26 NOTE — Anesthesia Procedure Notes (Signed)
Procedure Name: LMA Insertion Date/Time: 07/26/2015 7:20 AM Performed by: Peyton NajjarSIMMONS, Kelliann Pendergraph Pre-anesthesia Checklist: Patient identified, Patient being monitored, Timeout performed, Emergency Drugs available and Suction available Patient Re-evaluated:Patient Re-evaluated prior to inductionOxygen Delivery Method: Circle system utilized Preoxygenation: Pre-oxygenation with 100% oxygen Intubation Type: IV induction Ventilation: Mask ventilation without difficulty LMA: LMA inserted LMA Size: 4.0 Tube type: Oral Number of attempts: 1 Placement Confirmation: positive ETCO2 and breath sounds checked- equal and bilateral Tube secured with: Tape Dental Injury: Teeth and Oropharynx as per pre-operative assessment

## 2015-07-26 NOTE — Discharge Instructions (Signed)
AMBULATORY SURGERY  DISCHARGE INSTRUCTIONS   1) The drugs that you were given will stay in your system until tomorrow so for the next 24 hours you should not:  A) Drive an automobile B) Make any legal decisions C) Drink any alcoholic beverage   2) You may resume regular meals tomorrow.  Today it is better to start with liquids and gradually work up to solid foods.  You may eat anything you prefer, but it is better to start with liquids, then soup and crackers, and gradually work up to solid foods.   3) Please notify your doctor immediately if you have any unusual bleeding, trouble breathing, redness and pain at the surgery site, drainage, fever, or pain not relieved by medication.    4) Additional Instructions: VERBAL DRESSING INSTRUCTIONS BY DR MENZ                                                    YOU MAY WANT TO START ON A STOOL SOFTNER WHILE TAKING NARCOTIC PAIN MEDICAINE IF NOT ALREADY ON ONE         Please contact your physician with any problems or Same Day Surgery at [phone], Monday through Friday 6 am to 4 pm, or [place] at [facility] Main number at [phone].

## 2015-07-26 NOTE — Op Note (Signed)
07/26/2015  8:55 AM  PATIENT:  Veronica Flynn  50 y.o. female  PRE-OPERATIVE DIAGNOSIS:  right knee arthritis, stress fracture,fracture is a nondisplaced medial tibial condyle right knee  POST-OPERATIVE DIAGNOSIS:  right knee plica, stress fracture plica,fracture is a nondisplaced medial tibial condyle right knee  PROCEDURE:  Procedure(s): right knee arthroscopy, arthroscopic removal of plica, partial synovectomy,subchondroplasty (Right)  sub-chondroplasty is arthroscopic radiated treatment of tibial fracture proximal tibial plateau unicondylar [id number]   SURGEON: Leitha SchullerMichael J Dorraine Ellender, MD  ASSISTNone ANESTHESIA:   general  EBL:  Total I/O In: -  Out: 20 [Blood:20]  BLOOD ADMINISTERED:none  DRAINS: none   LOCAL MEDICATIONS USED:  MARCAINE     SPECIMEN:  No Specimen  DISPOSITION OF SPECIMEN:  N/A  COUNTS:  YES  TOURNIQUET:  * No tourniquets in log *  IMPLANT  Accu fill 5 cc  DICTATION: .Dragon Dictation patient brought the operating room and after adequate anesthesia was obtained the right leg was prepped and draped in sterile fashion with a tourniquet applied in the arthroscopic leg holder placed the upper thigh. After patient identification and timeout procedures were completed, an inferior lateral portal was made and the scope was introduced. Initial inspection revealed moderate patellofemoral degenerative change with normal tracking coming around medially there is a thick medial plica that impinged on the femoral condyle and is subsequently removed with use of the ArthroCare wand. Gutters were free of any loose bodies going to the medial compartment inferior medial portal was made and on probing there is fissuring of the articular cartilage on the femoral condyle and grade 2-3 changes on the tibial condyle but no meniscus tear. Anterior cruciate ligament was intact and lateral compartment showed mild tibial chondromalacia with fibrillation but without fissuring or exposed bone at this  point the arthroscopy equipment was removed and C-arm was brought in. A small incision was made medially and out wire driven across the proximal tibia approximate centimeter deep to the joint line in the area of the fascial line. The Accu fill was then mixed and that was used to fill this area there is good fill within the area of the arch tibial plateau fracture evidence on MRI. The delivery device was slowly pulled back so that the Accu fill could be used to go from the mid portion of the tibia all the way to the medial side where there is the most stress reaction. After filling the proximal tibia with 5 cc the trocar was held in place to allow the Accu fill to harden and prevent extravasation. After this was completed after approximate 10 minutes repeat scope showed no extravasation into the joint. The knee was irrigated until clear argentation withdrawn. Incisions were closed with simple interrupted 4-0 nylon skin sutures and 30 cc half percent Sensorcaine was infiltrated 10 cc in each portal and 10 cc in the area of the sub-chondroplasty incision Xeroform 4 x 4 web roll and Ace wrap then applied   PLAN OF CARE: Discharge to home after PACU  PATIENT DISPOSITION:  PACU - hemodynamically stable.

## 2015-07-26 NOTE — Anesthesia Preprocedure Evaluation (Signed)
Anesthesia Evaluation  Patient identified by MRN, date of birth, ID band Patient awake    Reviewed: Allergy & Precautions, H&P , NPO status , Patient's Chart, lab work & pertinent test results, reviewed documented beta blocker date and time   History of Anesthesia Complications (+) PONV and history of anesthetic complications  Airway Mallampati: III  TM Distance: >3 FB Neck ROM: full    Dental  (+) Teeth Intact   Pulmonary neg pulmonary ROS, resolved, Current Smoker,    Pulmonary exam normal        Cardiovascular Exercise Tolerance: Good negative cardio ROS Normal cardiovascular exam Rate:Normal     Neuro/Psych PSYCHIATRIC DISORDERS negative neurological ROS  negative psych ROS   GI/Hepatic negative GI ROS, Neg liver ROS, GERD  ,  Endo/Other  negative endocrine ROSHypothyroidism   Renal/GU negative Renal ROS  negative genitourinary   Musculoskeletal   Abdominal   Peds  Hematology negative hematology ROS (+)   Anesthesia Other Findings   Reproductive/Obstetrics negative OB ROS                             Anesthesia Physical Anesthesia Plan  ASA: III  Anesthesia Plan: General LMA   Post-op Pain Management:    Induction:   Airway Management Planned:   Additional Equipment:   Intra-op Plan:   Post-operative Plan:   Informed Consent: I have reviewed the patients History and Physical, chart, labs and discussed the procedure including the risks, benefits and alternatives for the proposed anesthesia with the patient or authorized representative who has indicated his/her understanding and acceptance.     Plan Discussed with: CRNA  Anesthesia Plan Comments:         Anesthesia Quick Evaluation

## 2015-07-26 NOTE — Transfer of Care (Addendum)
Immediate Anesthesia Transfer of Care Note  Patient: Veronica Flynn  Procedure(s) Performed: Procedure(s): right knee arthroscopy, arthroscopic removal of plica, partial synovectomy,subchondroplasty (Right)  Patient Location: PACU  Anesthesia Type:General  Level of Consciousness: awake, alert , oriented and patient cooperative  Airway & Oxygen Therapy: Patient Spontanous Breathing and Patient connected to nasal cannula oxygen  Post-op Assessment: Report given to RN, Post -op Vital signs reviewed and stable and Patient moving all extremities X 4  Post vital signs: Reviewed and stable  Last Vitals:  Filed Vitals:   07/26/15 0608 07/26/15 0857  BP: 127/85 145/83  Pulse: 80 95  Temp: 37 C 36.1 C  Resp: 16 12    Complications: No apparent anesthesia complications

## 2016-06-17 IMAGING — RF DG LUMBAR SPINE 2-3V
1 series · 2 of 2 positions shown · non-contrast
Comparison: None.

CLINICAL DATA: L5-S1 posterior laminectomy and fusion.

EXAM:
DG C-ARM 61-120 MIN; LUMBAR SPINE - 2-3 VIEW

[Series 1: run · 2 of 2 slices shown]
[im 1/2]
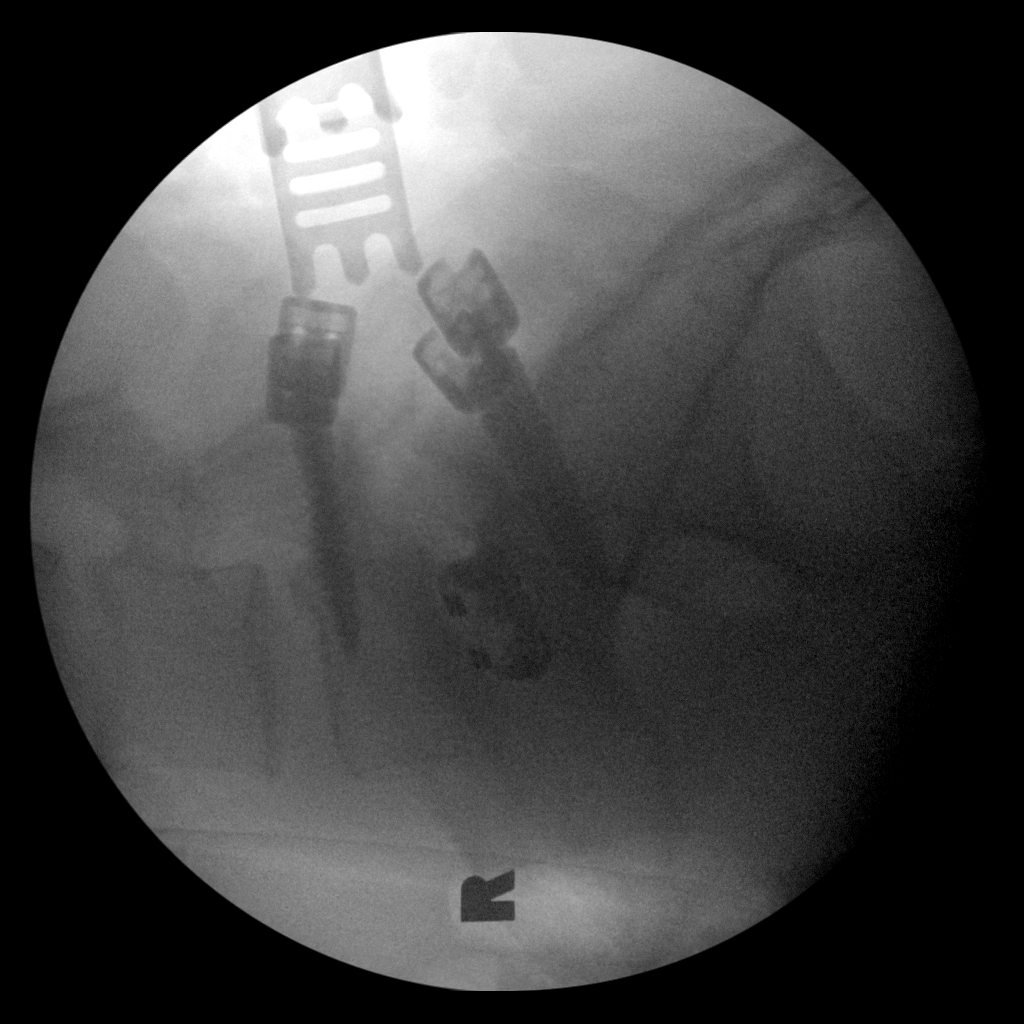
[im 2/2]
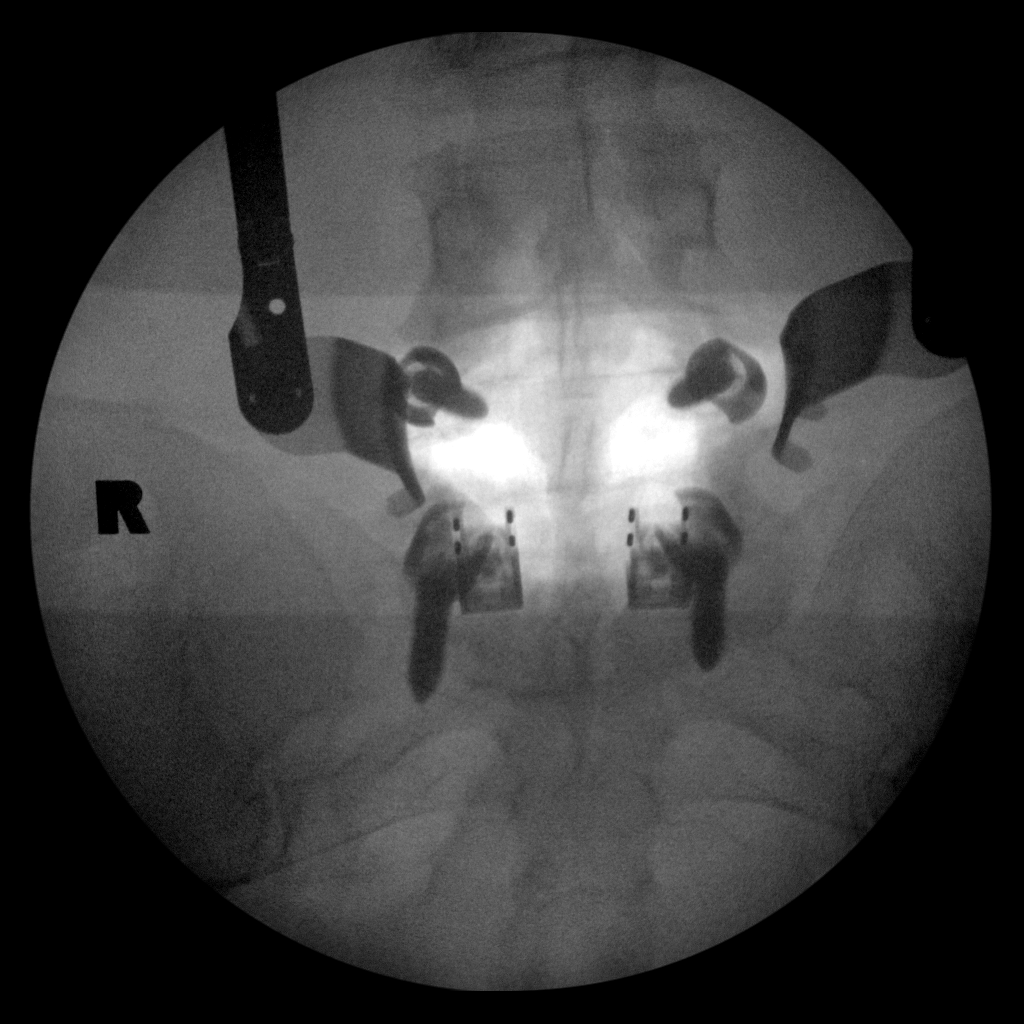

[2 of 2 positions shown; findings below may reference images not displayed]

FINDINGS: AP an lateral C-arm views demonstrate bilateral pedicle screw
placement at the L5 and S1 levels with interbody spacers at the
L5-S1 level. Level determination is based on the assumption of the
last open disc space at the L5-S1 level.
IMPRESSION: Intraoperative fusion at the L5-S1 level.

## 2016-12-08 ENCOUNTER — Other Ambulatory Visit: Payer: Self-pay | Admitting: Neurosurgery

## 2016-12-08 DIAGNOSIS — M5137 Other intervertebral disc degeneration, lumbosacral region: Secondary | ICD-10-CM

## 2016-12-24 ENCOUNTER — Ambulatory Visit
Admission: RE | Admit: 2016-12-24 | Discharge: 2016-12-24 | Disposition: A | Discharge status: Home / Self Care | Payer: BLUE CROSS/BLUE SHIELD | Source: Ambulatory Visit | Attending: Neurosurgery | Admitting: Neurosurgery

## 2016-12-24 DIAGNOSIS — M5137 Other intervertebral disc degeneration, lumbosacral region: Secondary | ICD-10-CM

## 2016-12-24 MED ORDER — GADOBENATE DIMEGLUMINE 529 MG/ML IV SOLN
20.0000 mL | Freq: Once | INTRAVENOUS | Status: AC | PRN
  Administered 2016-12-24: 20 mL via INTRAVENOUS

## 2018-12-03 ENCOUNTER — Other Ambulatory Visit: Payer: Self-pay | Admitting: Dentistry

## 2018-12-03 ENCOUNTER — Ambulatory Visit
Admission: RE | Admit: 2018-12-03 | Discharge: 2018-12-03 | Disposition: A | Discharge status: Home / Self Care | Payer: Disability Insurance | Source: Ambulatory Visit | Attending: Dentistry | Admitting: Dentistry

## 2018-12-03 DIAGNOSIS — M5137 Other intervertebral disc degeneration, lumbosacral region: Secondary | ICD-10-CM | POA: Diagnosis not present

## 2019-07-25 ENCOUNTER — Emergency Department
Admission: EM | Admit: 2019-07-25 | Discharge: 2019-07-25 | Disposition: A | Discharge status: Home / Self Care | Payer: Self-pay | Attending: Emergency Medicine | Admitting: Emergency Medicine

## 2019-07-25 ENCOUNTER — Emergency Department: Payer: Self-pay

## 2019-07-25 ENCOUNTER — Encounter: Payer: Self-pay | Admitting: Emergency Medicine

## 2019-07-25 ENCOUNTER — Other Ambulatory Visit: Payer: Self-pay

## 2019-07-25 DIAGNOSIS — K5792 Diverticulitis of intestine, part unspecified, without perforation or abscess without bleeding: Secondary | ICD-10-CM | POA: Insufficient documentation

## 2019-07-25 HISTORY — DX: Other cervical disc degeneration, unspecified cervical region: M50.30

## 2019-07-25 LAB — COMPREHENSIVE METABOLIC PANEL
ALT: 20 U/L (ref 0–44)
AST: 22 U/L (ref 15–41)
Albumin: 4.1 g/dL (ref 3.5–5.0)
Alkaline Phosphatase: 55 U/L (ref 38–126)
Anion gap: 11 (ref 5–15)
BUN: 12 mg/dL (ref 6–20)
CO2: 27 mmol/L (ref 22–32)
Calcium: 9 mg/dL (ref 8.9–10.3)
Chloride: 100 mmol/L (ref 98–111)
Creatinine, Ser: 0.82 mg/dL (ref 0.44–1.00)
GFR calc Af Amer: >60 mL/min (ref >60)
GFR calc non Af Amer: >60 mL/min (ref >60)
Glucose, Bld: 110 mg/dL — ABNORMAL HIGH (ref 70–99)
Potassium: 3.7 mmol/L (ref 3.5–5.1)
Sodium: 138 mmol/L (ref 135–145)
Total Bilirubin: 0.7 mg/dL (ref 0.3–1.2)
Total Protein: 7.2 g/dL (ref 6.5–8.1)

## 2019-07-25 LAB — URINALYSIS, COMPLETE (UACMP) WITH MICROSCOPIC
Bacteria, UA: NONE SEEN
Bilirubin Urine: NEGATIVE
Glucose, UA: NEGATIVE mg/dL
Ketones, ur: NEGATIVE mg/dL
Leukocytes,Ua: NEGATIVE
Nitrite: NEGATIVE
Protein, ur: NEGATIVE mg/dL
Specific Gravity, Urine: 1.017 (ref 1.005–1.030)
pH: 6 (ref 5.0–8.0)

## 2019-07-25 LAB — CBC
HCT: 40.6 % (ref 36.0–46.0)
Hemoglobin: 14.1 g/dL (ref 12.0–15.0)
MCH: 30.5 pg (ref 26.0–34.0)
MCHC: 34.7 g/dL (ref 30.0–36.0)
MCV: 87.7 fL (ref 80.0–100.0)
Platelets: 313 10*3/uL (ref 150–400)
RBC: 4.63 MIL/uL (ref 3.87–5.11)
RDW: 12.9 % (ref 11.5–15.5)
WBC: 11 10*3/uL — ABNORMAL HIGH (ref 4.0–10.5)
nRBC: 0 % (ref 0.0–0.2)

## 2019-07-25 LAB — LIPASE, BLOOD: Lipase: 21 U/L (ref 11–51)

## 2019-07-25 MED ORDER — CIPROFLOXACIN HCL 500 MG PO TABS: 500.0000 mg | ORAL_TABLET | Freq: Two times a day (BID) | ORAL | 0 refills | Status: AC

## 2019-07-25 MED ORDER — METRONIDAZOLE 500 MG PO TABS: 500.0000 mg | ORAL_TABLET | Freq: Three times a day (TID) | ORAL | 0 refills | Status: AC

## 2019-07-25 MED ORDER — ONDANSETRON 4 MG PO TBDP: 4.0000 mg | ORAL_TABLET | Freq: Three times a day (TID) | ORAL | 0 refills | Status: DC | PRN

## 2019-07-25 MED ORDER — KETOROLAC TROMETHAMINE 30 MG/ML IJ SOLN
15.0000 mg | Freq: Once | INTRAMUSCULAR | Status: AC
  Administered 2019-07-25: 18:00:00 15 mg via INTRAVENOUS
  Filled 2019-07-25: qty 1

## 2019-07-25 MED ORDER — IOHEXOL 300 MG/ML  SOLN
100.0000 mL | Freq: Once | INTRAMUSCULAR | Status: AC | PRN
  Administered 2019-07-25: 17:00:00 100 mL via INTRAVENOUS
  Filled 2019-07-25: qty 100

## 2019-07-25 MED ORDER — OXYCODONE HCL 5 MG PO TABS: 5.0000 mg | ORAL_TABLET | Freq: Three times a day (TID) | ORAL | 0 refills | Status: AC | PRN

## 2019-07-25 MED ORDER — ONDANSETRON HCL 4 MG/2ML IJ SOLN
4.0000 mg | Freq: Once | INTRAMUSCULAR | Status: AC
  Administered 2019-07-25: 4 mg via INTRAVENOUS
  Filled 2019-07-25: qty 2

## 2019-07-25 MED ORDER — FENTANYL CITRATE (PF) 100 MCG/2ML IJ SOLN
75.0000 ug | Freq: Once | INTRAMUSCULAR | Status: AC
  Administered 2019-07-25: 16:00:00 75 ug via INTRAVENOUS
  Filled 2019-07-25: qty 2

## 2019-07-25 MED ORDER — SODIUM CHLORIDE 0.9 % IV BOLUS
1000.0000 mL | Freq: Once | INTRAVENOUS | Status: AC
  Administered 2019-07-25: 16:00:00 1000 mL via INTRAVENOUS

## 2019-07-25 NOTE — ED Triage Notes (Signed)
Pt c/o mid lower mid abdominal pain.  Hx of IBS and normally has large bowel movements.  Normally feels better after bowel movement but pain not going away.  Lately has had very small bowel movements. No vomiting. No vaginal discharge.  Has to strain to urinate.

## 2019-07-25 NOTE — Discharge Instructions (Addendum)
.     Take the antibiotics to help with the infection.  Take Zofran to help with nausea.  Take Tylenol 1 g every 8 hours and ibuprofen 600 every 6 hours with food.  Take the oxycodone for breakthrough pain.  If you are taking the oxycodone take a capful of MiraLAX daily to prevent constipation. Will need coloscopy in 6 weeks.     IMPRESSION:  1. Acute uncomplicated sigmoid diverticulitis. No perforation, fluid  collection, or abscess.  2. Trace free fluid within the pelvis.

## 2019-07-25 NOTE — ED Provider Notes (Signed)
[facility] Emergency Department Provider Note  ____________________________________________   First MD Initiated Contact with Patient 07/25/19 1550     (approximate)  I have reviewed the triage vital signs and the nursing notes.   HISTORY  Chief Complaint Abdominal Pain    HPI Veronica Flynn is a 54 y.o. female with irritable bowel syndrome, prior hysterectomy who comes in with lower abdominal pain.  Patient reports severe lower abdominal pain over the past week, constant with occasional sharp stabbing and cramping sensations, has not take anything to help the pain, nothing seems to make it worse in general.  Has had some associated nausea but no vomiting.  States that her stools have been a little bit harder in nature and that normally she has more explosive diarrhea.  Denies being on any opioids.  Denies any chest pain, shortness of breath, urinary symptoms.          Past Medical History:  Diagnosis Date   Arthritis    Chronic back pain    DDD (degenerative disc disease), cervical     There are no problems to display for this patient.   Past Surgical History:  Procedure Laterality Date   ABDOMINAL HYSTERECTOMY     BACK SURGERY      Prior to Admission medications   Not on File    Allergies Penicillins  History reviewed. No pertinent family history.  Social History Social History   Tobacco Use   Smoking status: Current Every Day Smoker   Smokeless tobacco: Never Used  Substance Use Topics   Alcohol use: Yes   Drug use: Never      Review of Systems Constitutional: No fever/chills Eyes: No visual changes. ENT: No sore throat. Cardiovascular: Denies chest pain. Respiratory: Denies shortness of breath. Gastrointestinal: Positive lower abdominal pain, nausea, constipation Genitourinary: Negative for dysuria. Musculoskeletal: Negative for back pain. Skin: Negative for rash. Neurological: Negative for headaches,  focal weakness or numbness. All other ROS negative ____________________________________________   PHYSICAL EXAM:  VITAL SIGNS: ED Triage Vitals  Enc Vitals Group     BP 07/25/19 1325 125/88     Pulse Rate 07/25/19 1325 (!) 113     Resp 07/25/19 1325 18     Temp 07/25/19 1325 99.4 F (37.4 C)     Temp Source 07/25/19 1325 Oral     SpO2 07/25/19 1325 97 %     Weight 07/25/19 1327 220 lb (99.8 kg)     Height 07/25/19 1327 5\' 7"  (1.702 m)     Head Circumference --      Peak Flow --      Pain Score 07/25/19 1327 8     Pain Loc --      Pain Edu? --      Excl. in [place]? --     Constitutional: Alert and oriented. Well appearing and in no acute distress. Eyes: Conjunctivae are normal. EOMI. Head: Atraumatic. Nose: No congestion/rhinnorhea. Mouth/Throat: Mucous membranes are moist.   Neck: No stridor. Trachea Midline. FROM Cardiovascular: Normal rate, regular rhythm. Grossly normal heart sounds.  Good peripheral circulation. Respiratory: Normal respiratory effort.  No retractions. Lungs CTAB. Gastrointestinal: Tender in the lower abdomen.  No distention. No abdominal bruits.  Musculoskeletal: No lower extremity tenderness nor edema.  No joint effusions. Neurologic:  Normal speech and language. No gross focal neurologic deficits are appreciated.  Skin:  Skin is warm, dry and intact. No rash noted. Psychiatric: Mood and affect are normal. Speech and behavior are normal.  GU: Deferred   ____________________________________________   LABS (all labs ordered are listed, but only abnormal results are displayed)  Labs Reviewed  COMPREHENSIVE METABOLIC PANEL - Abnormal; Notable for the following components:      Result Value   Glucose, Bld 110 (*)    All other components within normal limits  CBC - Abnormal; Notable for the following components:   WBC 11.0 (*)    All other components within normal limits  URINALYSIS, COMPLETE (UACMP) WITH MICROSCOPIC - Abnormal; Notable for the  following components:   Color, Urine YELLOW (*)    APPearance HAZY (*)    Hgb urine dipstick MODERATE (*)    All other components within normal limits  LIPASE, BLOOD   ____________________________________________ RADIOLOGY  Official radiology report(s): CT ABDOMEN PELVIS W CONTRAST  Result Date: 07/25/2019 CLINICAL DATA:  Abdominal distension, mid to lower abdominal pain, irritable bowel syndrome EXAM: CT ABDOMEN AND PELVIS WITH CONTRAST TECHNIQUE: Multidetector CT imaging of the abdomen and pelvis was performed using the standard protocol following bolus administration of intravenous contrast. CONTRAST:  OMNIPAQUE IOHEXOL 300 MG/ML  SOLN COMPARISON:  None. FINDINGS: Lower chest: No acute pleural or parenchymal lung disease. Hepatobiliary: No focal liver abnormality is seen. Status post cholecystectomy. No biliary dilatation. Pancreas: Unremarkable. No pancreatic ductal dilatation or surrounding inflammatory changes. Spleen: Normal in size without focal abnormality. Adrenals/Urinary Tract: Adrenal glands are unremarkable. Kidneys are normal, without renal calculi, focal lesion, or hydronephrosis. Bladder is unremarkable. Stomach/Bowel: There is wall thickening of the mid sigmoid colon with surrounding fat stranding, consistent with acute uncomplicated diverticulitis. No bowel obstruction or ileus. The appendix is not identified and may be surgically absent. Vascular/Lymphatic: Minimal atherosclerosis of the abdominal aorta. No pathologically enlarged lymph nodes within the abdomen or pelvis. Reproductive: Status post hysterectomy. No adnexal masses. Other: Trace free fluid within the pelvis. There is mesenteric fat stranding within the lower pelvis. No free intraperitoneal gas. No evidence of hernia. Musculoskeletal: No acute or destructive bony lesions. Postsurgical changes are seen at L5/S1. Reconstructed images demonstrate no additional findings. IMPRESSION: 1. Acute uncomplicated sigmoid  diverticulitis. No perforation, fluid collection, or abscess. 2. Trace free fluid within the pelvis. Electronically Signed   By: Sharlet Salina M.D.   On: 07/25/2019 16:50    ____________________________________________   PROCEDURES  Procedure(s) performed (including Critical Care):  Procedures   ____________________________________________   INITIAL IMPRESSION / ASSESSMENT AND PLAN / ED COURSE  Veronica Flynn was evaluated in Emergency Department on 07/25/2019 for the symptoms described in the history of present illness. She was evaluated in the context of the global COVID-19 pandemic, which necessitated consideration that the patient might be at risk for infection with the SARS-CoV-2 virus that causes COVID-19. Institutional protocols and algorithms that pertain to the evaluation of patients at risk for COVID-19 are in a state of rapid change based on information released by regulatory bodies including the CDC and federal and state organizations. These policies and algorithms were followed during the patient's care in the ED.    Patient is a well-appearing 54 year old although does have some low-grade temps and elevated heart rate who comes in with lower abdominal tenderness.  Will get labs to evaluate for electrolyte abnormalities, AKI.  Will get CT scan to evaluate for diverticulitis, perforation, abscess, SBO.  No symptoms suggest UTI.  We will give a dose of IV pain medicine, IV Zofran and IV fluids.    IMPRESSION:  1. Acute uncomplicated sigmoid diverticulitis. No perforation, fluid  collection, or  abscess.  2. Trace free fluid within the pelvis.   Patient CT scan is consistent with acute uncomplicated sigmoid diverticulitis.  Patient has no evidence of complicated diverticulitis requiring admission.  Discussed with patient that she will need a colonoscopy in 6 weeks  Discussed treatment with antibiotics, Zofran, Tylenol ibuprofen oxycodone for breakthrough pain.  Discussed  using MiraLAX to prevent constipation.  Discussed not driving or working while on the oxycodone.  Patient not having any vomiting, pain well controlled and feels comfortable with going home at this time  I discussed the provisional nature of ED diagnosis, the treatment so far, the ongoing plan of care, follow up appointments and return precautions with the patient and any family or support people present. They expressed understanding and agreed with the plan, discharged home.  ____________________________________________   FINAL CLINICAL IMPRESSION(S) / ED DIAGNOSES   Final diagnoses:  Diverticulitis      MEDICATIONS GIVEN DURING THIS VISIT:  Medications  sodium chloride 0.9 % bolus 1,000 mL (1,000 mLs Intravenous New Bag/Given 07/25/19 1611)  ondansetron (ZOFRAN) injection 4 mg (4 mg Intravenous Given 07/25/19 1613)  fentaNYL (SUBLIMAZE) injection 75 mcg (75 mcg Intravenous Given 07/25/19 1614)  iohexol (OMNIPAQUE) 300 MG/ML solution 100 mL (100 mLs Intravenous Contrast Given 07/25/19 1638)     ED Discharge Orders         Ordered    metroNIDAZOLE (FLAGYL) 500 MG tablet  3 times daily     07/25/19 1709    ciprofloxacin (CIPRO) 500 MG tablet  2 times daily     07/25/19 1709    ondansetron (ZOFRAN ODT) 4 MG disintegrating tablet  Every 8 hours PRN     07/25/19 1709    oxyCODONE (ROXICODONE) 5 MG immediate release tablet  Every 8 hours PRN     07/25/19 1709           Note:  This document was prepared using Dragon voice recognition software and may include unintentional dictation errors.   Concha Se, MD 07/25/19 1710

## 2019-07-26 ENCOUNTER — Encounter: Payer: Self-pay | Admitting: *Deleted

## 2020-01-10 ENCOUNTER — Other Ambulatory Visit: Payer: Self-pay | Admitting: Podiatry

## 2020-01-11 NOTE — Discharge Instructions (Signed)
[facility]  POST OPERATIVE INSTRUCTIONS FOR DR. TROXLER, DR. FOWLER, AND [facility] PODIATRY DEPART[facility]   1. Take your medication as prescribed.  Pain medication should be taken only as needed.  2. Keep the dressing clean, dry and intact.  3. Keep your foot elevated above the heart level for the first 48 hours.  4. Walking to the bathroom and brief periods of walking are acceptable, unless we have instructed you to be non-weight bearing.  5. Always wear your post-op shoe when walking.  Always use your crutches if you are to be non-weight bearing.  6. Do not take a shower. Baths are permissible as long as the foot is kept out of the water.   7. Every hour you are awake:  - Bend your knee 15 times. - Flex foot 15 times - Massage calf 15 times  8. Call [facility] ([phone]) if any of the following problems occur: - You develop a temperature or fever. - The bandage becomes saturated with blood. - Medication does not stop your pain. - Injury of the foot occurs. - Any symptoms of infection including redness, odor, or red streaks running from wound.   General Anesthesia, Adult, Care After This sheet gives you information about how to care for yourself after your procedure. Your health care provider may also give you more specific instructions. If you have problems or questions, contact your health care provider. What can I expect after the procedure? After the procedure, the following side effects are common:  Pain or discomfort at the IV site.  Nausea.  Vomiting.  Sore throat.  Trouble concentrating.  Feeling cold or chills.  Weak or tired.  Sleepiness and fatigue.  Soreness and body aches. These side effects can affect parts of the body that were not involved in surgery. Follow these instructions at home:  For at least 24 hours after the procedure:  Have a responsible adult stay with you. It is  important to have someone help care for you until you are awake and alert.  Rest as needed.  Do not: ? Participate in activities in which you could fall or become injured. ? Drive. ? Use heavy machinery. ? Drink alcohol. ? Take sleeping pills or medicines that cause drowsiness. ? Make important decisions or sign legal documents. ? Take care of children on your own. Eating and drinking  Follow any instructions from your health care provider about eating or drinking restrictions.  When you feel hungry, start by eating small amounts of foods that are soft and easy to digest (bland), such as toast. Gradually return to your regular diet.  Drink enough fluid to keep your urine pale yellow.  If you vomit, rehydrate by drinking water, juice, or clear broth. General instructions  If you have sleep apnea, surgery and certain medicines can increase your risk for breathing problems. Follow instructions from your health care provider about wearing your sleep device: ? Anytime you are sleeping, including during daytime naps. ? While taking prescription pain medicines, sleeping medicines, or medicines that make you drowsy.  Return to your normal activities as told by your health care provider. Ask your health care provider what activities are safe for you.  Take over-the-counter and prescription medicines only as told by your health care provider.  If you smoke, do not smoke without supervision.  Keep all follow-up visits as told by your health care provider. This is important. Contact a health care provider if:    You have nausea or vomiting that does not get better with medicine.  You cannot eat or drink without vomiting.  You have pain that does not get better with medicine.  You are unable to pass urine.  You develop a skin rash.  You have a fever.  You have redness around your IV site that gets worse. Get help right away if:  You have difficulty breathing.  You have chest  pain.  You have blood in your urine or stool, or you vomit blood. Summary  After the procedure, it is common to have a sore throat or nausea. It is also common to feel tired.  Have a responsible adult stay with you for the first 24 hours after general anesthesia. It is important to have someone help care for you until you are awake and alert.  When you feel hungry, start by eating small amounts of foods that are soft and easy to digest (bland), such as toast. Gradually return to your regular diet.  Drink enough fluid to keep your urine pale yellow.  Return to your normal activities as told by your health care provider. Ask your health care provider what activities are safe for you. This information is not intended to replace advice given to you by your health care provider. Make sure you discuss any questions you have with your health care provider. Document Revised: 04/17/2017 Document Reviewed: 11/28/2016 Elsevier Patient Education  2020 [employer]  

## 2020-01-12 ENCOUNTER — Other Ambulatory Visit: Payer: Self-pay

## 2020-01-12 ENCOUNTER — Encounter: Payer: Self-pay | Admitting: Podiatry

## 2020-01-16 ENCOUNTER — Other Ambulatory Visit: Payer: Self-pay

## 2020-01-16 ENCOUNTER — Other Ambulatory Visit: Payer: Medicare Other

## 2020-01-16 ENCOUNTER — Other Ambulatory Visit
Admission: RE | Admit: 2020-01-16 | Discharge: 2020-01-16 | Disposition: A | Discharge status: Home / Self Care | Payer: Medicare Other | Source: Ambulatory Visit | Attending: Podiatry | Admitting: Podiatry

## 2020-01-16 DIAGNOSIS — Z20822 Contact with and (suspected) exposure to covid-19: Secondary | ICD-10-CM | POA: Insufficient documentation

## 2020-01-16 DIAGNOSIS — Z01812 Encounter for preprocedural laboratory examination: Secondary | ICD-10-CM | POA: Insufficient documentation

## 2020-01-17 LAB — SARS CORONAVIRUS 2 (TAT 6-24 HRS): SARS Coronavirus 2: NEGATIVE

## 2020-01-18 ENCOUNTER — Encounter: Payer: Self-pay | Admitting: Podiatry

## 2020-01-18 ENCOUNTER — Ambulatory Visit: Payer: Medicare Other | Admitting: Anesthesiology

## 2020-01-18 ENCOUNTER — Ambulatory Visit
Admission: RE | Admit: 2020-01-18 | Discharge: 2020-01-18 | Disposition: A | Discharge status: Home / Self Care | Payer: Medicare Other | Attending: Podiatry | Admitting: Podiatry

## 2020-01-18 ENCOUNTER — Encounter
Admission: RE | Disposition: A | Discharge status: Home / Self Care | Payer: Self-pay | Source: Home / Self Care | Attending: Podiatry

## 2020-01-18 ENCOUNTER — Other Ambulatory Visit: Payer: Self-pay

## 2020-01-18 DIAGNOSIS — F17200 Nicotine dependence, unspecified, uncomplicated: Secondary | ICD-10-CM | POA: Diagnosis not present

## 2020-01-18 DIAGNOSIS — K219 Gastro-esophageal reflux disease without esophagitis: Secondary | ICD-10-CM | POA: Insufficient documentation

## 2020-01-18 DIAGNOSIS — E039 Hypothyroidism, unspecified: Secondary | ICD-10-CM | POA: Insufficient documentation

## 2020-01-18 DIAGNOSIS — Z90710 Acquired absence of both cervix and uterus: Secondary | ICD-10-CM | POA: Insufficient documentation

## 2020-01-18 DIAGNOSIS — Z86010 Personal history of colonic polyps: Secondary | ICD-10-CM | POA: Diagnosis not present

## 2020-01-18 DIAGNOSIS — G8929 Other chronic pain: Secondary | ICD-10-CM | POA: Insufficient documentation

## 2020-01-18 DIAGNOSIS — M549 Dorsalgia, unspecified: Secondary | ICD-10-CM | POA: Diagnosis not present

## 2020-01-18 DIAGNOSIS — F329 Major depressive disorder, single episode, unspecified: Secondary | ICD-10-CM | POA: Diagnosis not present

## 2020-01-18 DIAGNOSIS — Z8349 Family history of other endocrine, nutritional and metabolic diseases: Secondary | ICD-10-CM | POA: Insufficient documentation

## 2020-01-18 DIAGNOSIS — Z811 Family history of alcohol abuse and dependence: Secondary | ICD-10-CM | POA: Diagnosis not present

## 2020-01-18 DIAGNOSIS — Z9049 Acquired absence of other specified parts of digestive tract: Secondary | ICD-10-CM | POA: Diagnosis not present

## 2020-01-18 DIAGNOSIS — M899 Disorder of bone, unspecified: Secondary | ICD-10-CM | POA: Diagnosis not present

## 2020-01-18 HISTORY — PX: BONE EXCISION: SHX6730

## 2020-01-18 SURGERY — BONE EXCISION
Anesthesia: General | Site: Foot | Laterality: Left

## 2020-01-18 MED ORDER — LIDOCAINE HCL (CARDIAC) PF 100 MG/5ML IV SOSY
PREFILLED_SYRINGE | INTRAVENOUS | Status: DC | PRN
  Administered 2020-01-18: 20 mg via INTRATRACHEAL

## 2020-01-18 MED ORDER — OXYCODONE HCL 5 MG/5ML PO SOLN: 5.0000 mg | Freq: Once | ORAL | Status: AC | PRN

## 2020-01-18 MED ORDER — BUPIVACAINE-EPINEPHRINE 0.5% -1:200000 IJ SOLN
INTRAMUSCULAR | Status: DC | PRN
  Administered 2020-01-18 (×2): 10 mL

## 2020-01-18 MED ORDER — OXYCODONE-ACETAMINOPHEN 5-325 MG PO TABS: 1.0000 | ORAL_TABLET | Freq: Four times a day (QID) | ORAL | 0 refills | Status: AC | PRN

## 2020-01-18 MED ORDER — MIDAZOLAM HCL 5 MG/5ML IJ SOLN
INTRAMUSCULAR | Status: DC | PRN
  Administered 2020-01-18: 2 mg via INTRAVENOUS

## 2020-01-18 MED ORDER — HYDROMORPHONE HCL 1 MG/ML IJ SOLN: 0.2500 mg | INTRAMUSCULAR | Status: DC | PRN

## 2020-01-18 MED ORDER — ACETAMINOPHEN 325 MG PO TABS: 325.0000 mg | ORAL_TABLET | ORAL | Status: DC | PRN

## 2020-01-18 MED ORDER — OXYCODONE HCL 5 MG PO TABS
5.0000 mg | ORAL_TABLET | Freq: Once | ORAL | Status: AC | PRN
  Administered 2020-01-18: 5 mg via ORAL

## 2020-01-18 MED ORDER — DEXAMETHASONE SODIUM PHOSPHATE 4 MG/ML IJ SOLN
INTRAMUSCULAR | Status: DC | PRN
  Administered 2020-01-18: 4 mg via INTRAVENOUS

## 2020-01-18 MED ORDER — PROPOFOL 10 MG/ML IV BOLUS
INTRAVENOUS | Status: DC | PRN
  Administered 2020-01-18: 170 mg via INTRAVENOUS

## 2020-01-18 MED ORDER — PHENYLEPHRINE HCL (PRESSORS) 10 MG/ML IV SOLN
INTRAVENOUS | Status: DC | PRN
  Administered 2020-01-18 (×2): 100 ug via INTRAVENOUS

## 2020-01-18 MED ORDER — ONDANSETRON HCL 4 MG/2ML IJ SOLN: 4.0000 mg | Freq: Once | INTRAMUSCULAR | Status: DC | PRN

## 2020-01-18 MED ORDER — LACTATED RINGERS IV SOLN: INTRAVENOUS | Status: DC

## 2020-01-18 MED ORDER — ACETAMINOPHEN 160 MG/5ML PO SOLN: 325.0000 mg | ORAL | Status: DC | PRN

## 2020-01-18 MED ORDER — CLINDAMYCIN PHOSPHATE 900 MG/50ML IV SOLN
900.0000 mg | INTRAVENOUS | Status: AC
  Administered 2020-01-18: 900 mg via INTRAVENOUS

## 2020-01-18 MED ORDER — METOCLOPRAMIDE HCL 5 MG/ML IJ SOLN
INTRAMUSCULAR | Status: DC | PRN
  Administered 2020-01-18: 10 mg via INTRAVENOUS

## 2020-01-18 MED ORDER — FENTANYL CITRATE (PF) 100 MCG/2ML IJ SOLN
INTRAMUSCULAR | Status: DC | PRN
Start: 2020-01-18 — End: 2020-01-18
  Administered 2020-01-18 (×4): 25 ug via INTRAVENOUS

## 2020-01-18 MED ORDER — ONDANSETRON HCL 4 MG/2ML IJ SOLN
INTRAMUSCULAR | Status: DC | PRN
  Administered 2020-01-18: 4 mg via INTRAVENOUS

## 2020-01-18 MED ORDER — POVIDONE-IODINE 7.5 % EX SOLN: Freq: Once | CUTANEOUS | Status: AC

## 2020-01-18 MED ORDER — FAMOTIDINE IN NACL 20-0.9 MG/50ML-% IV SOLN
20.0000 mg | Freq: Once | INTRAVENOUS | Status: AC
  Administered 2020-01-18: 20 mg via INTRAVENOUS

## 2020-01-18 MED ORDER — EPHEDRINE SULFATE 50 MG/ML IJ SOLN
INTRAMUSCULAR | Status: DC | PRN
  Administered 2020-01-18 (×2): 5 mg via INTRAVENOUS

## 2020-01-18 SURGICAL SUPPLY — 36 items
APL SKNCLS STERI-STRIP NONHPOA (GAUZE/BANDAGES/DRESSINGS) ×1
BENZOIN TINCTURE PRP APPL 2/3 (GAUZE/BANDAGES/DRESSINGS) ×2 IMPLANT
BLADE SURG 15 STRL LF DISP TIS (BLADE) IMPLANT
BLADE SURG 15 STRL SS (BLADE) ×2
BNDG CMPR 75X41 PLY HI ABS (GAUZE/BANDAGES/DRESSINGS) ×1
BNDG COHESIVE 4X5 TAN STRL (GAUZE/BANDAGES/DRESSINGS) ×2 IMPLANT
BNDG ELASTIC 4X5.8 VLCR STR LF (GAUZE/BANDAGES/DRESSINGS) ×2 IMPLANT
BNDG ESMARK 4X12 TAN STRL LF (GAUZE/BANDAGES/DRESSINGS) ×2 IMPLANT
BNDG GAUZE 4.5X4.1 6PLY STRL (MISCELLANEOUS) ×2 IMPLANT
BNDG STRETCH 4X75 STRL LF (GAUZE/BANDAGES/DRESSINGS) ×2 IMPLANT
CANISTER SUCT 1200ML W/VALVE (MISCELLANEOUS) ×2 IMPLANT
COVER LIGHT HANDLE UNIVERSAL (MISCELLANEOUS) ×4 IMPLANT
CUFF TOURN SGL QUICK 18X4 (TOURNIQUET CUFF) ×1 IMPLANT
DRAPE FLUOR MINI C-ARM 54X84 (DRAPES) ×2 IMPLANT
DURAPREP 26ML APPLICATOR (WOUND CARE) ×2 IMPLANT
ELECT REM PT RETURN 9FT ADLT (ELECTROSURGICAL) ×2
ELECTRODE REM PT RTRN 9FT ADLT (ELECTROSURGICAL) ×1 IMPLANT
GAUZE SPONGE 4X4 12PLY STRL (GAUZE/BANDAGES/DRESSINGS) ×2 IMPLANT
GAUZE XEROFORM 1X8 LF (GAUZE/BANDAGES/DRESSINGS) ×2 IMPLANT
GLOVE BIO SURGEON STRL SZ7.5 (GLOVE) ×2 IMPLANT
GLOVE INDICATOR 8.0 STRL GRN (GLOVE) ×2 IMPLANT
GOWN STRL REUS W/ TWL LRG LVL3 (GOWN DISPOSABLE) ×2 IMPLANT
GOWN STRL REUS W/TWL LRG LVL3 (GOWN DISPOSABLE) ×4
KIT TURNOVER KIT A (KITS) ×2 IMPLANT
NS IRRIG 500ML POUR BTL (IV SOLUTION) ×2 IMPLANT
PACK EXTREMITY ARMC (MISCELLANEOUS) ×2 IMPLANT
PENCIL SMOKE EVACUATOR (MISCELLANEOUS) ×2 IMPLANT
RASP SM TEAR CROSS CUT (RASP) ×1 IMPLANT
STOCKINETTE IMPERVIOUS LG (DRAPES) ×2 IMPLANT
STRIP CLOSURE SKIN 1/4X4 (GAUZE/BANDAGES/DRESSINGS) ×2 IMPLANT
SUT MNCRL 4-0 (SUTURE) ×2
SUT MNCRL 4-0 27XMFL (SUTURE) ×1
SUT VIC AB 4-0 SH 27 (SUTURE) ×2
SUT VIC AB 4-0 SH 27XANBCTRL (SUTURE) IMPLANT
SUTURE MNCRL 4-0 27XMF (SUTURE) IMPLANT
SYR BULB IRRIG 60ML STRL (SYRINGE) ×1 IMPLANT

## 2020-01-18 NOTE — H&P (Signed)
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  The various methods of treatment have been discussed with the patient.  No guarantees were given.  After consideration of risks, benefits and other options for treatment, the patient has consented to surgery.  I have reviewed the patients' chart and labs.     A history and physical examination was performed in my office.  The patient was reexamined.  There have been no changes to this history and physical examination.  Gwyneth Revels A

## 2020-01-18 NOTE — Op Note (Signed)
Operative note   Surgeon:Lynnsie Linders Lawyer: None    Preop diagnosis: Exostosis left midfoot    Postop diagnosis: Same    Procedure: 1.  Exostectomy second cuneiform joint 2.  Exostectomy intercuneiform joint 3.  Exostectomy first met cuneiform joint 4.  Exostectomy navicular cuneiform joint    EBL: Minimal    Anesthesia:local and general.  Local consisted of a total of 20 cc of 0.25% bupivacaine with epinephrine 1-200,000    Hemostasis: Epinephrine 1-200,000 infiltrated with the local anesthetic    Specimen: None    Complications: None    Operative indications:Veronica Flynn is an 53 y.o. that presents today for surgical intervention.  The risks/benefits/alternatives/complications have been discussed and consent has been given.    Procedure:  Patient was brought into the OR and placed on the operating table in thesupine position. After anesthesia was obtained theleft lower extremity was prepped and draped in usual sterile fashion.  Attention was directed to the dorsal aspect of the left midfoot where a curvilinear incision was performed from the navicular cuneiform joint to the base of the second metatarsal.  Sharp and blunt dissection was carried down to the deeper fascial layer.  Subfascial and subperiosteal dissection was then undertaken at this time.  Care was taken to retract the neurovascular bundle at all times.  At this time the exostosis was noted.  This encompassed the second met cuneiforms intercuneiform first met cuneiform and navicular cuneiform joints.  After tedious dissection was performed.  The exostosis was then removed with a combination of a rongeur and rasp.  This was taken down to good normal level.  The joints were then inspected at this time as they had initially been covered completely with bone.  The joint itself looked to be within normal limits without severe loss of cartilage.  The wounds were flushed with copious amounts of irrigation.  Closure was  then performed with a 4-0 Vicryl for the deeper and subcutaneous tissue and a 4-0 Monocryl undyed for the skin.  A bulky sterile dressing was applied.    Patient tolerated the procedure and anesthesia well.  Was transported from the OR to the PACU with all vital signs stable and vascular status intact. To be discharged per routine protocol.  Will follow up in approximately 1 week in the outpatient clinic.  A prescription for oxycodone was sent to her pharmacy.

## 2020-01-18 NOTE — Anesthesia Preprocedure Evaluation (Signed)
Anesthesia Evaluation  Patient identified by MRN, date of birth, ID band Patient awake    Reviewed: Allergy & Precautions, H&P , NPO status , Patient's Chart, lab work & pertinent test results, reviewed documented beta blocker date and time   Airway Mallampati: I  TM Distance: >3 FB Neck ROM: full    Dental   Broken tooth, upper left.  None loose:   Pulmonary neg pulmonary ROS, Current Smoker,    Pulmonary exam normal breath sounds clear to auscultation       Cardiovascular Exercise Tolerance: Good negative cardio ROS Normal cardiovascular exam Rhythm:regular Rate:Normal     Neuro/Psych Depression Chronic back pain, tx'ed with Oxycodone in past, but not for last 5 years due to lack of insurance. negative neurological ROS     GI/Hepatic Neg liver ROS, GERD  ,Reflux controlled only with Rolaids at this time, due to lack of insurance.   Endo/Other  Hypothyroidism   Renal/GU negative Renal ROS  negative genitourinary   Musculoskeletal   Abdominal   Peds  Hematology negative hematology ROS (+)   Anesthesia Other Findings   Reproductive/Obstetrics negative OB ROS                             Anesthesia Physical Anesthesia Plan  ASA: II  Anesthesia Plan: General   Post-op Pain Management:    Induction:   PONV Risk Score and Plan: Ondansetron  Airway Management Planned:   Additional Equipment:   Intra-op Plan:   Post-operative Plan:   Informed Consent: I have reviewed the patients History and Physical, chart, labs and discussed the procedure including the risks, benefits and alternatives for the proposed anesthesia with the patient or authorized representative who has indicated his/her understanding and acceptance.     Dental Advisory Given  Plan Discussed with: CRNA and Anesthesiologist  Anesthesia Plan Comments:         Anesthesia Quick Evaluation

## 2020-01-18 NOTE — Anesthesia Postprocedure Evaluation (Signed)
Anesthesia Post Note  Patient: Veronica Flynn  Procedure(s) Performed: BONE SPUR EXCISION/SADDLEBONE (Left Foot)     Patient location during evaluation: PACU Anesthesia Type: General Level of consciousness: awake and alert Pain management: pain level controlled Vital Signs Assessment: post-procedure vital signs reviewed and stable Respiratory status: spontaneous breathing, nonlabored ventilation, respiratory function stable and patient connected to nasal cannula oxygen Cardiovascular status: blood pressure returned to baseline and stable Postop Assessment: no apparent nausea or vomiting Anesthetic complications: no   No complications documented.  Alta Corning

## 2020-01-18 NOTE — Transfer of Care (Signed)
Immediate Anesthesia Transfer of Care Note  Patient: Veronica Flynn  Procedure(s) Performed: BONE SPUR EXCISION/SADDLEBONE (Left Foot)  Patient Location: PACU  Anesthesia Type: General  Level of Consciousness: awake, alert  and patient cooperative  Airway and Oxygen Therapy: Patient Spontanous Breathing and Patient connected to supplemental oxygen  Post-op Assessment: Post-op Vital signs reviewed, Patient's Cardiovascular Status Stable, Respiratory Function Stable, Patent Airway and No signs of Nausea or vomiting  Post-op Vital Signs: Reviewed and stable  Complications: No complications documented.

## 2020-01-18 NOTE — Anesthesia Procedure Notes (Addendum)
Procedure Name: LMA Insertion Date/Time: 01/18/2020 12:08 PM Performed by: Maree Krabbe, CRNA Pre-anesthesia Checklist: Patient identified, Emergency Drugs available, Suction available, Timeout performed and Patient being monitored Patient Re-evaluated:Patient Re-evaluated prior to induction Oxygen Delivery Method: Circle system utilized Preoxygenation: Pre-oxygenation with 100% oxygen Induction Type: IV induction LMA: LMA inserted LMA Size: 4.0 Number of attempts: 1 Placement Confirmation: positive ETCO2 and breath sounds checked- equal and bilateral Tube secured with: Tape Dental Injury: Teeth and Oropharynx as per pre-operative assessment

## 2021-10-10 ENCOUNTER — Other Ambulatory Visit: Payer: Self-pay | Admitting: Orthopedic Surgery

## 2021-10-10 DIAGNOSIS — M25511 Pain in right shoulder: Secondary | ICD-10-CM

## 2021-10-10 DIAGNOSIS — S46001A Unspecified injury of muscle(s) and tendon(s) of the rotator cuff of right shoulder, initial encounter: Secondary | ICD-10-CM

## 2021-10-10 DIAGNOSIS — M25311 Other instability, right shoulder: Secondary | ICD-10-CM

## 2021-10-21 ENCOUNTER — Ambulatory Visit
Admission: RE | Admit: 2021-10-21 | Discharge: 2021-10-21 | Disposition: A | Discharge status: Home / Self Care | Payer: Medicare Other | Source: Ambulatory Visit | Attending: Orthopedic Surgery | Admitting: Orthopedic Surgery

## 2021-10-21 DIAGNOSIS — M25511 Pain in right shoulder: Secondary | ICD-10-CM

## 2021-10-21 DIAGNOSIS — S46001A Unspecified injury of muscle(s) and tendon(s) of the rotator cuff of right shoulder, initial encounter: Secondary | ICD-10-CM

## 2021-10-21 DIAGNOSIS — M25311 Other instability, right shoulder: Secondary | ICD-10-CM

## 2021-11-07 ENCOUNTER — Other Ambulatory Visit: Payer: Self-pay | Admitting: Orthopedic Surgery

## 2021-11-13 NOTE — Discharge Instructions (Addendum)
Post-Op Instructions - Rotator Cuff Repair  1. Bracing: You will wear a shoulder immobilizer or sling for 6 weeks.   2. Driving: No driving for 3 weeks post-op. When driving, do not wear the immobilizer. Ideally, we recommend no driving for 6 weeks while sling is in place as one arm will be immobilized.   3. Activity: No active lifting for 2 months. Wrist, hand, and elbow motion only. Avoid lifting the upper arm away from the body except for hygiene. You are permitted to bend and straighten the elbow passively only (no active elbow motion). You may use your hand and wrist for typing, writing, and managing utensils (cutting food). Do not lift more than a coffee cup for 8 weeks.  When sleeping or resting, inclined positions (recliner chair or wedge pillow) and a pillow under the forearm for support may provide better comfort for up to 4 weeks.  Avoid long distance travel for 4 weeks.  Return to normal activities after rotator cuff repair repair normally takes 6 months on average. If rehab goes very well, may be able to do most activities at 4 months, except overhead or contact sports.  4. Physical Therapy: Begins 3-4 days after surgery, and proceed 1 time per week for the first 6 weeks, then 1-2 times per week from weeks 6-20 post-op.  5. Medications:  - You will be provided a prescription for narcotic pain medicine. After surgery, take 1-2 narcotic tablets every 4 hours if needed for severe pain.  - A prescription for anti-nausea medication will be provided in case the narcotic medicine causes nausea - take 1 tablet every 6 hours only if nauseated.   - Take tylenol 1000 mg (2 Extra Strength tablets or 3 regular strength) every 8 hours for pain.  May decrease or stop tylenol 5 days after surgery if you are having minimal pain. - Take ASA 325mg/day x 2 weeks to help prevent DVTs/PEs (blood clots).  - DO NOT take ANY nonsteroidal anti-inflammatory pain medications (Advil, Motrin, Ibuprofen, Aleve,  Naproxen, or Naprosyn). These medicines can inhibit healing of your shoulder repair.    If you are taking prescription medication for anxiety, depression, insomnia, muscle spasm, chronic pain, or for attention deficit disorder, you are advised that you are at a higher risk of adverse effects with use of narcotics post-op, including narcotic addiction/dependence, depressed breathing, death. If you use non-prescribed substances: alcohol, marijuana, cocaine, heroin, methamphetamines, etc., you are at a higher risk of adverse effects with use of narcotics post-op, including narcotic addiction/dependence, depressed breathing, death. You are advised that taking > 50 morphine milligram equivalents (MME) of narcotic pain medication per day results in twice the risk of overdose or death. For your prescription provided: oxycodone 5 mg - taking more than 6 tablets per day would result in > 50 morphine milligram equivalents (MME) of narcotic pain medication. Be advised that we will prescribe narcotics short-term, for acute post-operative pain only - 3 weeks for major operations such as shoulder repair/reconstruction surgeries.     6. Post-Op Appointment:  Your first post-op appointment will be 10-14 days post-op.  7. Work or School: For most, but not all procedures, we advise staying out of work or school for at least 1 to 2 weeks in order to recover from the stress of surgery and to allow time for healing.   If you need a work or school note this can be provided.   8. Smoking: If you are a smoker, you need to refrain from   smoking in the postoperative period. The nicotine in cigarettes will inhibit healing of your shoulder repair and decrease the chance of successful repair. Similarly, nicotine containing products (gum, patches) should be avoided.   Post-operative Brace: Apply and remove the brace you received as you were instructed to at the time of fitting and as described in detail as the brace's  instructions for use indicate.  Wear the brace for the period of time prescribed by your physician.  The brace can be cleaned with soap and water and allowed to air dry only.  Should the brace result in increased pain, decreased feeling (numbness/tingling), increased swelling or an overall worsening of your medical condition, please contact your doctor immediately.  If an emergency situation occurs as a result of wearing the brace after normal business hours, please dial 911 and seek immediate medical attention.  Let your doctor know if you have any further questions about the brace issued to you. Refer to the shoulder sling instructions for use if you have any questions regarding the correct fit of your shoulder sling.  [facility] Customer Care for Troubleshooting: [phone]  Video that illustrates how to properly use a shoulder sling: "Instructions for Proper Use of an Orthopaedic Sling" https://www.youtube.com/watch?v=AHZpn_Xo45w        PERIPHERAL NERVE BLOCK PATIENT INFORMATION  Your surgeon has requested a peripheral nerve block for your surgery. This anesthetic technique provides excellent post-operative pain relief for you in a safe and effective manner. It will also help reduce the risk of nausea and vomiting and allow earlier discharge from the hospital.   The block is performed under sedation with ultrasound guidance prior to your procedure. Due to the sedation, your may or may not remember the block experience. The nerve block will begin to take effect anywhere from 5 to 30 minutes after being administered. You will be transported to the operating room from your surgery after the block is completed.   At the end of surgery, when the anesthesia wears off, you will notice a few things. Your may not be able to move or feel the part of your body targeted by the nerve block. These are normal experiences, and they will disappear as the block wears off.  If you had an interscalene nerve block  performed (which is common for shoulder surgery), your voice can be very hoarse and you may feel that you are not able to take as deep a breath as you did before surgery. Some patients may also notice a droopy eyelid on the affected side. These symptoms will resolve once the block wears off.  Pain control: The nerve block technique used is a single injection that can last anywhere from 1-3 days. The duration of the numbness can vary between individuals. After leaving the hospital, it is important that you begin to take your prescribed pain medication when you start to sense the nerve block wearing off. This will help you avoid unpleasant pain at the time the nerve block wears off, which can sometimes be in the middle of the night. The block will only cover pain in the areas targeted by the nerve block so if you experience surgical pain outside of that area, please take your prescribed pain medication. Management of the "numb area": After a nerve block, you cannot feel pain, pressure, or temperature in the affected area so there is an increased risk for injury. You should take extra care to protect the affected areas until sensation and movement returns. Please take caution to not   come in contact with extremely hot or cold items because you will not be able to sense or protect yourself form the extremes of temperature.  You may experience some persistent numbness after the procedure by most neurological deficits resolve over time and the incidence of serious long term neurological complications attributable to peripheral nerve blocks are relatively uncommon.    Information for Discharge Teaching: EXPAREL (bupivacaine liposome injectable suspension)   Your surgeon or anesthesiologist gave you EXPAREL(bupivacaine) to help control your pain after surgery.  EXPAREL is a local anesthetic that provides pain relief by numbing the tissue around the surgical site. EXPAREL is designed to release pain medication over  time and can control pain for up to 72 hours. Depending on how you respond to EXPAREL, you may require less pain medication during your recovery.  Possible side effects: Temporary loss of sensation or ability to move in the area where bupivacaine was injected. Nausea, vomiting, constipation Rarely, numbness and tingling in your mouth or lips, lightheadedness, or anxiety may occur. Call your doctor right away if you think you may be experiencing any of these sensations, or if you have other questions regarding possible side effects.  Follow all other discharge instructions given to you by your surgeon or nurse. Eat a healthy diet and drink plenty of water or other fluids.  If you return to the hospital for any reason within 96 hours following the administration of EXPAREL, it is important for health care providers to know that you have received this anesthetic. A teal colored band has been placed on your arm with the date, time and amount of EXPAREL you have received in order to alert and inform your health care providers. Please leave this armband in place for the full 96 hours following administration, and then you may remove the band.  POLAR CARE INFORMATION  [email or web address]  How to use [facility] Polar Care [place] Cold Therapy System?  YouTube   https://www.youtube.com/watch?v=E5pJtyfj4co  OPERATING INSTRUCTIONS  Start the product With dry hands, connect the transformer to the electrical connection located on the top of the cooler. Next, plug the transformer into an appropriate electrical outlet. The unit will automatically start running at this point.  To stop the pump, disconnect electrical power.  Unplug to stop the product when not in use. Unplugging the Polar Care unit turns it off. Always unplug immediately after use. Never leave it plugged in while unattended. Remove pad.    FIRST ADD WATER TO FILL LINE, THEN ICE---Replace ice when existing ice is almost melted  1 Discuss Treatment  with your Licensed Health Care Practitioner and Use Only as Prescribed 2 Apply Insulation Barrier & Cold Therapy Pad 3 Check for Moisture 4 Inspect Skin Regularly  Tips and Trouble Shooting Usage Tips 1. Use cubed or chunked ice for optimal performance. 2. It is recommended to drain the Pad between uses. To drain the pad, hold the Pad upright with the hose pointed toward the ground. Depress the black plunger and allow water to drain out. 3. You may disconnect the Pad from the unit without removing the pad from the affected area by depressing the silver tabs on the hose coupling and gently pulling the hoses apart. The Pad and unit will seal itself and will not leak. Note: Some dripping during release is normal. 4. DO NOT RUN PUMP WITHOUT WATER! The pump in this unit is designed to run with water. Running the unit without water will cause permanent damage to the pump. 5. Unplug unit   before removing lid.  TROUBLESHOOTING GUIDE Pump not running, Water not flowing to the pad, Pad is not getting cold 1. Make sure the transformer is plugged into the wall outlet. 2. Confirm that the ice and water are filled to the indicated levels. 3. Make sure there are no kinks in the pad. 4. Gently pull on the blue tube to make sure the tube/pad junction is straight. 5. Remove the pad from the treatment site and ll it while the pad is lying at; then reapply. 6. Confirm that the pad couplings are securely attached to the unit. Listen for the double clicks (Figure 1) to confirm the pad couplings are securely attached.  Leaks    Note: Some condensation on the lines, controller, and pads is unavoidable, especially in warmer climates. 1. If using a [facility] Polar Care Cold Therapy unit with a detachable Cold Therapy Pad, and a leak exists (other than condensation on the lines) disconnect the pad couplings. Make sure the silver tabs on the couplings are depressed before reconnecting the pad to the pump hose; then confirm both  sides of the coupling are properly clicked in. 2. If the coupling continues to leak or a leak is detected in the pad itself, stop using it and call [facility] Customer Care at [phone].  Cleaning After use, empty and dry the unit with a soft cloth. Warm water and mild detergent may be used occasionally to clean the pump and tubes.  WARNING: The Polar Care Cube can be cold enough to cause serious injury, including full skin necrosis. Follow these Operating Instructions, and carefully read the Product Insert (see pouch on side of unit) and the Cold Therapy Pad Fitting Instructions (provided with each Cold Therapy Pad) prior to use.        

## 2021-11-14 ENCOUNTER — Encounter: Payer: Self-pay | Admitting: Orthopedic Surgery

## 2021-11-21 DIAGNOSIS — Z01818 Encounter for other preprocedural examination: Secondary | ICD-10-CM

## 2021-11-22 ENCOUNTER — Ambulatory Visit
Admission: RE | Admit: 2021-11-22 | Discharge: 2021-11-22 | Disposition: A | Discharge status: Home / Self Care | Payer: Medicare Other | Source: Ambulatory Visit | Attending: Orthopedic Surgery | Admitting: Orthopedic Surgery

## 2021-11-22 ENCOUNTER — Ambulatory Visit: Payer: Medicare Other | Admitting: Anesthesiology

## 2021-11-22 ENCOUNTER — Other Ambulatory Visit: Payer: Self-pay

## 2021-11-22 ENCOUNTER — Encounter
Admission: RE | Disposition: A | Discharge status: Home / Self Care | Payer: Self-pay | Source: Ambulatory Visit | Attending: Orthopedic Surgery

## 2021-11-22 ENCOUNTER — Encounter: Payer: Self-pay | Admitting: Orthopedic Surgery

## 2021-11-22 DIAGNOSIS — M7581 Other shoulder lesions, right shoulder: Secondary | ICD-10-CM | POA: Insufficient documentation

## 2021-11-22 DIAGNOSIS — F17210 Nicotine dependence, cigarettes, uncomplicated: Secondary | ICD-10-CM | POA: Insufficient documentation

## 2021-11-22 DIAGNOSIS — M25811 Other specified joint disorders, right shoulder: Secondary | ICD-10-CM | POA: Diagnosis not present

## 2021-11-22 DIAGNOSIS — W19XXXA Unspecified fall, initial encounter: Secondary | ICD-10-CM | POA: Diagnosis not present

## 2021-11-22 DIAGNOSIS — S46011A Strain of muscle(s) and tendon(s) of the rotator cuff of right shoulder, initial encounter: Secondary | ICD-10-CM | POA: Insufficient documentation

## 2021-11-22 DIAGNOSIS — M19011 Primary osteoarthritis, right shoulder: Secondary | ICD-10-CM | POA: Insufficient documentation

## 2021-11-22 DIAGNOSIS — E039 Hypothyroidism, unspecified: Secondary | ICD-10-CM | POA: Diagnosis not present

## 2021-11-22 DIAGNOSIS — W548XXA Other contact with dog, initial encounter: Secondary | ICD-10-CM | POA: Diagnosis not present

## 2021-11-22 DIAGNOSIS — Z01818 Encounter for other preprocedural examination: Secondary | ICD-10-CM

## 2021-11-22 HISTORY — PX: SHOULDER ARTHROSCOPY: SHX128

## 2021-11-22 SURGERY — ARTHROSCOPY, SHOULDER
Anesthesia: Regional | Site: Shoulder | Laterality: Right

## 2021-11-22 MED ORDER — LIDOCAINE HCL (CARDIAC) PF 100 MG/5ML IV SOSY
PREFILLED_SYRINGE | INTRAVENOUS | Status: DC | PRN
  Administered 2021-11-22: 50 mg via INTRATRACHEAL

## 2021-11-22 MED ORDER — MIDAZOLAM HCL 5 MG/5ML IJ SOLN
INTRAMUSCULAR | Status: DC | PRN
  Administered 2021-11-22: 2 mg via INTRAVENOUS

## 2021-11-22 MED ORDER — LACTATED RINGERS IV SOLN: INTRAVENOUS | Status: DC

## 2021-11-22 MED ORDER — ACETAMINOPHEN 500 MG PO TABS: 1000.0000 mg | ORAL_TABLET | Freq: Three times a day (TID) | ORAL | 2 refills | Status: AC

## 2021-11-22 MED ORDER — EPINEPHRINE PF 1 MG/ML IJ SOLN
INTRAMUSCULAR | Status: DC | PRN
  Administered 2021-11-22: 4 mg

## 2021-11-22 MED ORDER — ASPIRIN 325 MG PO TBEC: 325.0000 mg | DELAYED_RELEASE_TABLET | Freq: Every day | ORAL | 0 refills | Status: AC

## 2021-11-22 MED ORDER — ONDANSETRON 4 MG PO TBDP: 4.0000 mg | ORAL_TABLET | Freq: Three times a day (TID) | ORAL | 0 refills | Status: DC | PRN

## 2021-11-22 MED ORDER — FENTANYL CITRATE (PF) 100 MCG/2ML IJ SOLN
INTRAMUSCULAR | Status: DC | PRN
  Administered 2021-11-22: 50 ug via INTRAVENOUS

## 2021-11-22 MED ORDER — GLYCOPYRROLATE 0.2 MG/ML IJ SOLN
INTRAMUSCULAR | Status: DC | PRN
  Administered 2021-11-22: .1 mg via INTRAVENOUS

## 2021-11-22 MED ORDER — OXYCODONE HCL 5 MG PO TABS: 5.0000 mg | ORAL_TABLET | ORAL | 0 refills | Status: AC | PRN

## 2021-11-22 MED ORDER — ONDANSETRON HCL 4 MG/2ML IJ SOLN
INTRAMUSCULAR | Status: DC | PRN
  Administered 2021-11-22: 4 mg via INTRAVENOUS

## 2021-11-22 MED ORDER — BUPIVACAINE LIPOSOME 1.3 % IJ SUSP
INTRAMUSCULAR | Status: DC | PRN
  Administered 2021-11-22: 266 mg

## 2021-11-22 MED ORDER — CEFAZOLIN SODIUM-DEXTROSE 2-3 GM-%(50ML) IV SOLR
INTRAVENOUS | Status: DC | PRN
  Administered 2021-11-22: 2 g via INTRAVENOUS

## 2021-11-22 MED ORDER — BUPIVACAINE HCL (PF) 0.5 % IJ SOLN
INTRAMUSCULAR | Status: DC | PRN
  Administered 2021-11-22: 100 mg

## 2021-11-22 MED ORDER — PROPOFOL 10 MG/ML IV BOLUS
INTRAVENOUS | Status: DC | PRN
  Administered 2021-11-22: 150 mg via INTRAVENOUS

## 2021-11-22 MED ORDER — DEXAMETHASONE SODIUM PHOSPHATE 4 MG/ML IJ SOLN
INTRAMUSCULAR | Status: DC | PRN
  Administered 2021-11-22: 4 mg via INTRAVENOUS

## 2021-11-22 MED ORDER — SCOPOLAMINE 1 MG/3DAYS TD PT72
1.0000 | MEDICATED_PATCH | Freq: Once | TRANSDERMAL | Status: DC
  Administered 2021-11-22: 1.5 mg via TRANSDERMAL

## 2021-11-22 SURGICAL SUPPLY — 50 items
ADAPTER IRRIG TUBE 2 SPIKE SOL (ADAPTER) ×3 IMPLANT
ADPR TBG 2 SPK PMP STRL ASCP (ADAPTER) ×2
ANCH SUT 2 SWLK 19.1 CLS EYLT (Anchor) ×1 IMPLANT
ANCH SUT 2.9 PUSHLOCK ANCH (Orthopedic Implant) ×1 IMPLANT
ANCH SUT SWLK 19.1X5.5 CLS (Anchor) ×1 IMPLANT
ANCHOR ICONIX SPEED 2.3 (Anchor) ×2 IMPLANT
ANCHOR SWIVELOCK BIO 4.75X19.1 (Anchor) ×1 IMPLANT
ANCHOR SWIVELOCK BIO COMP (Anchor) ×1 IMPLANT
APL PRP STRL LF DISP 70% ISPRP (MISCELLANEOUS) ×1
BLADE SHAVER 4.5X7 STR FR (MISCELLANEOUS) ×2 IMPLANT
BUR BR 5.5 WIDE MOUTH (BURR) ×2 IMPLANT
CANNULA PART THRD DISP 5.75X7 (CANNULA) ×2 IMPLANT
CANNULA TWIST IN 8.25X7CM (CANNULA) ×1 IMPLANT
CHLORAPREP W/TINT 26 (MISCELLANEOUS) ×2 IMPLANT
COOLER POLAR GLACIER W/PUMP (MISCELLANEOUS) ×2 IMPLANT
DEVICE SUCT BLK HOLE OR FLOOR (MISCELLANEOUS) ×1 IMPLANT
DRAPE U 60X70 (DRAPES) ×3 IMPLANT
ELECT REM PT RETURN 9FT ADLT (ELECTROSURGICAL) ×2
ELECTRODE REM PT RTRN 9FT ADLT (ELECTROSURGICAL) ×1 IMPLANT
GAUZE SPONGE 4X4 12PLY STRL (GAUZE/BANDAGES/DRESSINGS) ×2 IMPLANT
GAUZE XEROFORM 1X8 LF (GAUZE/BANDAGES/DRESSINGS) ×2 IMPLANT
GLOVE SRG 8 PF TXTR STRL LF DI (GLOVE) ×1 IMPLANT
GLOVE SURG ENC MOIS LTX SZ7.5 (GLOVE) ×4 IMPLANT
GLOVE SURG ENC MOIS LTX SZ8 (GLOVE) ×2 IMPLANT
GLOVE SURG UNDER POLY LF SZ8 (GLOVE) ×2
GOWN STRL REUS W/ TWL LRG LVL3 (GOWN DISPOSABLE) ×1 IMPLANT
GOWN STRL REUS W/ TWL XL LVL3 (GOWN DISPOSABLE) ×1 IMPLANT
GOWN STRL REUS W/TWL LRG LVL3 (GOWN DISPOSABLE) ×4
GOWN STRL REUS W/TWL XL LVL3 (GOWN DISPOSABLE) ×2
IV LACTATED RINGER IRRG 3000ML (IV SOLUTION) ×22
IV LR IRRIG 3000ML ARTHROMATIC (IV SOLUTION) ×6 IMPLANT
KIT STABILIZATION SHOULDER (MISCELLANEOUS) ×2 IMPLANT
KIT TURNOVER KIT A (KITS) ×2 IMPLANT
MANIFOLD NEPTUNE II (INSTRUMENTS) ×3 IMPLANT
MASK FACE SPIDER DISP (MASK) ×2 IMPLANT
MAT ABSORB  FLUID 56X50 GRAY (MISCELLANEOUS) ×2
MAT ABSORB FLUID 56X50 GRAY (MISCELLANEOUS) ×1 IMPLANT
NDL SAFETY ECLIPSE 18X1.5 (NEEDLE) ×1 IMPLANT
NEEDLE HYPO 18GX1.5 SHARP (NEEDLE) ×2
PACK ARTHROSCOPY SHOULDER (MISCELLANEOUS) ×2 IMPLANT
PAD WRAPON POLAR SHDR XLG (MISCELLANEOUS) ×1 IMPLANT
PASSER SUT CAPTURE FIRST (INSTRUMENTS) ×1 IMPLANT
SPONGE T-LAP 18X18 ~~LOC~~+RFID (SPONGE) ×2 IMPLANT
SYR 5ML LL (SYRINGE) ×2 IMPLANT
SYSTEM IMPL TENODESIS LNT 2.9 (Orthopedic Implant) ×1 IMPLANT
TAPE MICROFOAM 4IN (TAPE) ×1 IMPLANT
TUBING INFLOW SET DBFLO PUMP (TUBING) ×2 IMPLANT
TUBING OUTFLOW SET DBLFO PUMP (TUBING) ×2 IMPLANT
WAND WEREWOLF FLOW 90D (MISCELLANEOUS) ×2 IMPLANT
WRAPON POLAR PAD SHDR XLG (MISCELLANEOUS) ×2

## 2021-11-22 NOTE — Anesthesia Procedure Notes (Signed)
Procedure Name: LMA Insertion Date/Time: 11/22/2021 10:45 AM  Performed by: Jimmy Picket, CRNAPre-anesthesia Checklist: Patient identified, Emergency Drugs available, Suction available, Timeout performed and Patient being monitored Patient Re-evaluated:Patient Re-evaluated prior to induction Oxygen Delivery Method: Circle system utilized Preoxygenation: Pre-oxygenation with 100% oxygen Induction Type: IV induction LMA: LMA inserted LMA Size: 4.0 Number of attempts: 1 Placement Confirmation: positive ETCO2 and breath sounds checked- equal and bilateral Tube secured with: Tape

## 2021-11-22 NOTE — Anesthesia Preprocedure Evaluation (Addendum)
Anesthesia Evaluation  Patient identified by MRN, date of birth, ID band Patient awake    History of Anesthesia Complications (+) PROLONGED EMERGENCE and history of anesthetic complications  Airway Mallampati: II  TM Distance: >3 FB Neck ROM: Full    Dental no notable dental hx.    Pulmonary Current Smoker (1 ppd)Patient did not abstain from smoking.,    Pulmonary exam normal        Cardiovascular Exercise Tolerance: Good negative cardio ROS Normal cardiovascular exam     Neuro/Psych negative neurological ROS     GI/Hepatic negative GI ROS, Neg liver ROS,   Endo/Other  Hypothyroidism   Renal/GU negative Renal ROS     Musculoskeletal   Abdominal   Peds  Hematology   Anesthesia Other Findings   Reproductive/Obstetrics                             Anesthesia Physical Anesthesia Plan  ASA: 2  Anesthesia Plan: General and Regional   Post-op Pain Management: Regional block   Induction: Intravenous  PONV Risk Score and Plan: 2 and Ondansetron, Dexamethasone and Treatment may vary due to age or medical condition  Airway Management Planned: LMA  Additional Equipment: None  Intra-op Plan:   Post-operative Plan: Extubation in OR  Informed Consent: I have reviewed the patients History and Physical, chart, labs and discussed the procedure including the risks, benefits and alternatives for the proposed anesthesia with the patient or authorized representative who has indicated his/her understanding and acceptance.     Dental advisory given  Plan Discussed with: CRNA  Anesthesia Plan Comments:        Anesthesia Quick Evaluation

## 2021-11-22 NOTE — Transfer of Care (Signed)
Immediate Anesthesia Transfer of Care Note  Patient: Veronica Flynn  Procedure(s) Performed: Right shoulder athroscopic rotator cuff repair, biceps tenodesis, distal clavicle excision, subacromial decompression (Right: Shoulder)  Patient Location: PACU  Anesthesia Type: General, Regional  Level of Consciousness: awake, alert  and patient cooperative  Airway and Oxygen Therapy: Patient Spontanous Breathing and Patient connected to supplemental oxygen  Post-op Assessment: Post-op Vital signs reviewed, Patient's Cardiovascular Status Stable, Respiratory Function Stable, Patent Airway and No signs of Nausea or vomiting  Post-op Vital Signs: Reviewed and stable  Complications: No notable events documented.

## 2021-11-22 NOTE — Anesthesia Procedure Notes (Addendum)
Anesthesia Regional Block: Interscalene brachial plexus block   Pre-Anesthetic Checklist: , timeout performed,  Correct Patient, Correct Site, Correct Laterality,  Correct Procedure, Correct Position, site marked,  Risks and benefits discussed,  Surgical consent,  Pre-op evaluation,  At surgeon's request and post-op pain management  Laterality: Right  Prep: chloraprep       Needles:  Injection technique: Single-shot  Needle Type: Stimiplex     Needle Length: 10cm  Needle Gauge: 21     Additional Needles:   Procedures:,,,, ultrasound used (permanent image in chart),,    Narrative:  Start time: 11/22/2021 9:55 AM End time: 11/22/2021 10:00 AM Injection made incrementally with aspirations every 5 mL.  Performed by: Personally   Additional Notes: Functioning IV was confirmed and monitors applied. Ultrasound guidance: relevant anatomy identified, needle position confirmed, local anesthetic spread visualized around nerve(s)., vascular puncture avoided.  Image printed for medical record.  Negative aspiration and no paresthesias; incremental administration of local anesthetic. The patient tolerated the procedure well. Vitals signes recorded in RN notes.

## 2021-11-22 NOTE — Progress Notes (Signed)
Assisted Shannon Page ANMD with right, interscalene , ultrasound guided block. Side rails up, monitors on throughout procedure. See vital signs in flow sheet. Tolerated Procedure well. 

## 2021-11-22 NOTE — Op Note (Signed)
SURGERY DATE: 11/22/2021   PRE-OP DIAGNOSIS:  1. Right subacromial impingement 2. Right biceps tendinopathy 3. Right rotator cuff tear 4.  Right acromioclavicular joint arthritis   POST-OP DIAGNOSIS: 1. Right subacromial impingement 2. Right biceps tendinopathy 3. Right rotator cuff tear 4.  Right acromioclavicular joint arthritis   PROCEDURES:  1. Right arthroscopic rotator cuff repair 2. Right arthroscopic biceps tenodesis 3. Right arthroscopic subacromial decompression 4. Right arthroscopic extensive debridement of shoulder (glenohumeral and subacromial spaces) 5.  Right arthroscopic distal clavicle excision  SURGEON: Rosealee Albee, MD   ASSISTANT: Sonny Dandy, PA   ANESTHESIA: Gen with Exparel interscalene block   ESTIMATED BLOOD LOSS: 5cc   DRAINS:  none   TOTAL IV FLUIDS: per anesthesia      SPECIMENS: none   IMPLANTS:  - Arthrex 2.66mm PushLock x 1 - Arthrex 4.37mm SwiveLock x 1 - Arthrex 5.35mm SwiveLock x 1 - Iconix SPEED double loaded with 1.2 and 2.15mm tape x 2     OPERATIVE FINDINGS:  Examination under anesthesia: A careful examination under anesthesia was performed.  Passive range of motion was: FF: 150; ER at side: 45; ER in abduction: 90; IR in abduction: 45.  Anterior load shift: NT.  Posterior load shift: NT.  Sulcus in neutral: NT.  Sulcus in ER: NT.     Intra-operative findings: A thorough arthroscopic examination of the shoulder was performed.  The findings are: 1. Biceps tendon: tendinopathy with significant erythema and split thickness tearing/fraying 2. Superior labrum: erythema 3. Posterior labrum and capsule: normal 4. Inferior capsule and inferior recess: normal 5. Glenoid cartilage surface: Normal except for focal area of grade 2 degenerative changes of the posterior glenoid 6. Supraspinatus attachment: full-thickness tear of the supraspinatus 7. Posterior rotator cuff attachment: normal 8. Humeral head articular cartilage: normal 9.  Rotator interval: significant synovitis 10: Subscapularis tendon: attachment intact 11. Anterior labrum: Mildly degenerative 12. IGHL: normal   OPERATIVE REPORT:    Indications for procedure:  YOANNA JURCZYK is a 56 y.o. female with approximately 7 months of right shoulder pain after she had her arm suddenly pulled by her dog resulting in a fall.  She has had difficulty with overhead motion since that time with sensations of weakness. Clinical exam and MRI were suggestive of full-thickness rotator cuff tear, biceps tendon pathology, AC joint arthritis, and subacromial impingement.  Patient had undergone extensive nonoperative management without improvement in her symptoms.  After discussion of risks, benefits, and alternatives to surgery, the patient elected to proceed.    Procedure in detail:   I identified DEBORA STOCKDALE in the pre-operative holding area.  I marked the operative shoulder with my initials. I reviewed the risks and benefits of the proposed surgical intervention, and the patient wished to proceed.  Anesthesia was then performed with an Exparel interscalene block.  The patient was transferred to the operative suite and placed in the beach chair position.     Appropriate IV antibiotics were administered prior to incision. The operative upper extremity was then prepped and draped in standard fashion. A time out was performed confirming the correct extremity, correct patient, and correct procedure.    I then created a standard posterior portal with an 11 blade. The glenohumeral joint was easily entered with a blunt trocar and the arthroscope introduced. The findings of diagnostic arthroscopy are described above. I debrided degenerative tissue including the synovitic tissue about the rotator interval and anterior and superior labrum. I then coagulated the inflamed synovium to  obtain hemostasis and reduce the risk of post-operative swelling using an Arthrocare radiofrequency device.   I  then turned my attention to the arthroscopic biceps tenodesis.  The frayed fibers of the biceps tendon were debrided.  The Loop n Tack technique was used to pass a FiberTape through the biceps in a locked fashion adjacent to the biceps anchor.  A hole for a 2.9 mm Arthrex PushLock was drilled in the bicipital groove just superior to the subscapularis tendon insertion.  The biceps tendon was then cut and the biceps anchor complex was debrided down to a stable base on the superior labrum.  The FiberTape was loaded onto the PushLock anchor and impacted into place into the previously drilled hole in the bicipital groove.  This appropriately secured the biceps into the bicipital groove, removed it from the shoulder joint, and took it off of tension.   Next, the arthroscope was then introduced into the subacromial space. A direct lateral portal was created with an 11-blade after spinal needle localization. An extensive subacromial bursectomy was performed using a combination of the shaver and Arthrocare wand. The entire acromial undersurface was exposed and the CA ligament was subperiosteally elevated to expose the anterior acromial hook. A burr was used to create a flat anterior and lateral aspect of the acromion, converting it from a Type 2 to a Type 1 acromion. Care was made to keep the deltoid fascia intact.   I then turned my attention to the arthroscopic distal clavicle excision. I identified the acromioclavicular joint. Surrounding bursal tissue was debrided and the edges of the joint were identified. I used the 5.48mm barrel burr to remove the distal clavicle parallel to the edge of the acromion. I was able to fit two widths of the burr into the space between the distal clavicle and acromion, signifying that I had removed ~35mm of distal clavicle. This was confirmed by viewing anteriorly and introducing a probe with measuring marks from the lateral portal.   Next, I created an accessory posterolateral portal  to assist with visualization and instrumentation.  I debrided the poor quality edges of the supraspinatus tendon.  This was a U-shaped tear of the supraspinatus.  I prepared the footprint using a burr to expose bleeding bone.    I then percutaneously placed 1 Iconix SPEED medial row anchor along the anterior portion of the tear at the articular margin. Another SPEED anchor was placed along the posterior portion of the tear at the articular margin. I then shuttled all 8 strands of tape through the rotator cuff just lateral to the musculotendinous junction using a FirstPass suture passer spanning the anterior to posterior extent of the tear. The posterior strands of each suture were passed through an [employer] anchor.  This was placed approximately 2 cm distal to the lateral edge of the footprint in line with the posterior aspect of the tear with appropriate tensioning of each suture prior to final fixation.  However, the SwiveLock anchor did not achieve appropriate fixation so it was upsized to a 5.5 mm anchor.  This did achieve excellent fixation.  Similarly, the anterior strands of each suture were passed through another SwiveLock anchor along the anterior margin of the tear.  The repair stitch from the posterior anchor was passed in the region of a small dogear and tied arthroscopically to further reduce this portion of the tear.  This construct allowed for excellent reapproximation of the rotator cuff to its native footprint without undue tension.  Appropriate compression  was achieved.  The repair was stable to external and internal rotation.   Fluid was evacuated from the shoulder, and the portals were closed with 3-0 Nylon. Xeroform was applied to the portals. A sterile dressing was applied, followed by a Polar Care sleeve and a SlingShot shoulder immobilizer/sling. The patient was awakened from anesthesia without difficulty and was transferred to the PACU in stable condition.    Of note,  assistance from a PA was essential to performing the surgery.  PA was present for the entire surgery.  PA assisted with patient positioning, retraction, instrumentation, and wound closure. The surgery would have been more difficult and had longer operative time without PA assistance.   COMPLICATIONS: none   DISPOSITION: plan for discharge home after recovery in PACU     POSTOPERATIVE PLAN: Remain in sling (except hygiene and elbow/wrist/hand RoM exercises as instructed by PT) x 6 weeks and NWB for this time. PT to begin 3-4 days after surgery.  Large rotator cuff repair rehab protocol. ASA 325mg  daily x 2 weeks for DVT ppx.

## 2021-11-22 NOTE — Anesthesia Postprocedure Evaluation (Signed)
Anesthesia Post Note  Patient: Veronica Flynn  Procedure(s) Performed: Right shoulder athroscopic rotator cuff repair, biceps tenodesis, distal clavicle excision, subacromial decompression (Right: Shoulder)     Patient location during evaluation: PACU Anesthesia Type: Regional Level of consciousness: awake and alert Pain management: pain level controlled Vital Signs Assessment: post-procedure vital signs reviewed and stable Respiratory status: spontaneous breathing, nonlabored ventilation, respiratory function stable and patient connected to nasal cannula oxygen Cardiovascular status: blood pressure returned to baseline and stable Postop Assessment: no apparent nausea or vomiting Anesthetic complications: no   No notable events documented.  Wille Celeste Jorie Zee

## 2021-11-22 NOTE — H&P (Signed)
Paper H&P to be scanned into permanent record. H&P reviewed. No significant changes noted.  

## 2021-11-25 ENCOUNTER — Encounter: Payer: Self-pay | Admitting: Orthopedic Surgery

## 2022-04-24 ENCOUNTER — Other Ambulatory Visit: Payer: Self-pay | Admitting: Family Medicine

## 2022-04-24 ENCOUNTER — Other Ambulatory Visit: Payer: Self-pay | Admitting: Orthopedic Surgery

## 2022-04-24 DIAGNOSIS — M1711 Unilateral primary osteoarthritis, right knee: Secondary | ICD-10-CM

## 2022-04-24 DIAGNOSIS — G8929 Other chronic pain: Secondary | ICD-10-CM

## 2022-04-24 DIAGNOSIS — Z87312 Personal history of (healed) stress fracture: Secondary | ICD-10-CM

## 2022-04-24 DIAGNOSIS — Z9889 Other specified postprocedural states: Secondary | ICD-10-CM

## 2022-04-24 DIAGNOSIS — Z1231 Encounter for screening mammogram for malignant neoplasm of breast: Secondary | ICD-10-CM

## 2022-05-09 ENCOUNTER — Ambulatory Visit
Admission: RE | Admit: 2022-05-09 | Discharge: 2022-05-09 | Disposition: A | Discharge status: Home / Self Care | Payer: No Typology Code available for payment source | Source: Ambulatory Visit | Attending: Orthopedic Surgery | Admitting: Orthopedic Surgery

## 2022-05-09 DIAGNOSIS — M1711 Unilateral primary osteoarthritis, right knee: Secondary | ICD-10-CM

## 2022-05-09 DIAGNOSIS — Z87312 Personal history of (healed) stress fracture: Secondary | ICD-10-CM

## 2022-05-09 DIAGNOSIS — Z9889 Other specified postprocedural states: Secondary | ICD-10-CM

## 2022-05-09 DIAGNOSIS — G8929 Other chronic pain: Secondary | ICD-10-CM

## 2022-05-09 DIAGNOSIS — M25561 Pain in right knee: Secondary | ICD-10-CM | POA: Diagnosis not present

## 2022-05-22 DIAGNOSIS — M170 Bilateral primary osteoarthritis of knee: Secondary | ICD-10-CM | POA: Diagnosis not present

## 2022-06-02 DIAGNOSIS — M1612 Unilateral primary osteoarthritis, left hip: Secondary | ICD-10-CM | POA: Diagnosis not present

## 2022-06-02 DIAGNOSIS — Z981 Arthrodesis status: Secondary | ICD-10-CM | POA: Diagnosis not present

## 2022-06-02 DIAGNOSIS — M25552 Pain in left hip: Secondary | ICD-10-CM | POA: Diagnosis not present

## 2022-06-02 DIAGNOSIS — M5136 Other intervertebral disc degeneration, lumbar region: Secondary | ICD-10-CM | POA: Diagnosis not present

## 2022-06-02 DIAGNOSIS — M419 Scoliosis, unspecified: Secondary | ICD-10-CM | POA: Diagnosis not present

## 2022-06-09 ENCOUNTER — Other Ambulatory Visit: Payer: Self-pay | Admitting: Orthopedic Surgery

## 2022-06-09 DIAGNOSIS — M5410 Radiculopathy, site unspecified: Secondary | ICD-10-CM

## 2022-06-19 ENCOUNTER — Ambulatory Visit
Admission: RE | Admit: 2022-06-19 | Discharge: 2022-06-19 | Disposition: A | Discharge status: Home / Self Care | Payer: No Typology Code available for payment source | Source: Ambulatory Visit | Attending: Orthopedic Surgery | Admitting: Orthopedic Surgery

## 2022-06-19 DIAGNOSIS — M545 Low back pain, unspecified: Secondary | ICD-10-CM | POA: Diagnosis not present

## 2022-06-19 DIAGNOSIS — M25552 Pain in left hip: Secondary | ICD-10-CM | POA: Diagnosis not present

## 2022-06-19 DIAGNOSIS — M5410 Radiculopathy, site unspecified: Secondary | ICD-10-CM

## 2022-06-19 DIAGNOSIS — M4316 Spondylolisthesis, lumbar region: Secondary | ICD-10-CM | POA: Diagnosis not present

## 2022-06-19 DIAGNOSIS — M48061 Spinal stenosis, lumbar region without neurogenic claudication: Secondary | ICD-10-CM | POA: Diagnosis not present

## 2022-06-26 DIAGNOSIS — M7918 Myalgia, other site: Secondary | ICD-10-CM | POA: Diagnosis not present

## 2022-06-26 DIAGNOSIS — M5416 Radiculopathy, lumbar region: Secondary | ICD-10-CM | POA: Diagnosis not present

## 2022-06-26 DIAGNOSIS — M5136 Other intervertebral disc degeneration, lumbar region: Secondary | ICD-10-CM | POA: Diagnosis not present

## 2022-09-01 DIAGNOSIS — M5416 Radiculopathy, lumbar region: Secondary | ICD-10-CM | POA: Diagnosis not present

## 2022-09-10 DIAGNOSIS — M5136 Other intervertebral disc degeneration, lumbar region: Secondary | ICD-10-CM | POA: Diagnosis not present

## 2022-09-10 DIAGNOSIS — M5416 Radiculopathy, lumbar region: Secondary | ICD-10-CM | POA: Diagnosis not present

## 2022-09-30 DIAGNOSIS — M48062 Spinal stenosis, lumbar region with neurogenic claudication: Secondary | ICD-10-CM | POA: Diagnosis not present

## 2022-09-30 DIAGNOSIS — M5416 Radiculopathy, lumbar region: Secondary | ICD-10-CM | POA: Diagnosis not present

## 2022-10-22 DIAGNOSIS — M48062 Spinal stenosis, lumbar region with neurogenic claudication: Secondary | ICD-10-CM | POA: Diagnosis not present

## 2022-10-22 DIAGNOSIS — M160 Bilateral primary osteoarthritis of hip: Secondary | ICD-10-CM | POA: Diagnosis not present

## 2022-10-22 DIAGNOSIS — M1612 Unilateral primary osteoarthritis, left hip: Secondary | ICD-10-CM | POA: Diagnosis not present

## 2022-10-22 DIAGNOSIS — M5416 Radiculopathy, lumbar region: Secondary | ICD-10-CM | POA: Diagnosis not present

## 2022-10-22 DIAGNOSIS — M5136 Other intervertebral disc degeneration, lumbar region: Secondary | ICD-10-CM | POA: Diagnosis not present

## 2023-01-14 DIAGNOSIS — R300 Dysuria: Secondary | ICD-10-CM | POA: Diagnosis not present

## 2023-03-24 DIAGNOSIS — M48062 Spinal stenosis, lumbar region with neurogenic claudication: Secondary | ICD-10-CM | POA: Diagnosis not present

## 2023-03-24 DIAGNOSIS — R3915 Urgency of urination: Secondary | ICD-10-CM | POA: Diagnosis not present

## 2023-03-24 DIAGNOSIS — Z1211 Encounter for screening for malignant neoplasm of colon: Secondary | ICD-10-CM | POA: Diagnosis not present

## 2023-03-24 DIAGNOSIS — M461 Sacroiliitis, not elsewhere classified: Secondary | ICD-10-CM | POA: Diagnosis not present

## 2023-03-24 DIAGNOSIS — K219 Gastro-esophageal reflux disease without esophagitis: Secondary | ICD-10-CM | POA: Diagnosis not present

## 2023-03-24 DIAGNOSIS — K449 Diaphragmatic hernia without obstruction or gangrene: Secondary | ICD-10-CM | POA: Diagnosis not present

## 2023-03-24 DIAGNOSIS — R339 Retention of urine, unspecified: Secondary | ICD-10-CM | POA: Diagnosis not present

## 2023-03-31 ENCOUNTER — Other Ambulatory Visit: Payer: Self-pay | Admitting: Family Medicine

## 2023-03-31 DIAGNOSIS — Z1231 Encounter for screening mammogram for malignant neoplasm of breast: Secondary | ICD-10-CM

## 2023-05-01 ENCOUNTER — Ambulatory Visit
Admission: RE | Admit: 2023-05-01 | Discharge: 2023-05-01 | Disposition: A | Discharge status: Home / Self Care | Payer: No Typology Code available for payment source | Source: Ambulatory Visit | Attending: Family Medicine | Admitting: Family Medicine

## 2023-05-01 DIAGNOSIS — Z1231 Encounter for screening mammogram for malignant neoplasm of breast: Secondary | ICD-10-CM | POA: Insufficient documentation

## 2023-05-06 ENCOUNTER — Ambulatory Visit (INDEPENDENT_AMBULATORY_CARE_PROVIDER_SITE_OTHER): Payer: No Typology Code available for payment source | Admitting: Urology

## 2023-05-06 VITALS — BP 122/84 | HR 80 | Ht 67.0 in | Wt 203.0 lb

## 2023-05-06 DIAGNOSIS — R399 Unspecified symptoms and signs involving the genitourinary system: Secondary | ICD-10-CM

## 2023-05-06 DIAGNOSIS — R339 Retention of urine, unspecified: Secondary | ICD-10-CM

## 2023-05-06 DIAGNOSIS — R3129 Other microscopic hematuria: Secondary | ICD-10-CM

## 2023-05-06 DIAGNOSIS — R1084 Generalized abdominal pain: Secondary | ICD-10-CM

## 2023-05-06 NOTE — Progress Notes (Signed)
 I, Maysun LITTIE Griffiths, acting as a scribe for Glendia JAYSON Barba, MD., have documented all relevant documentation on the behalf of Glendia JAYSON Barba, MD, as directed by Glendia JAYSON Barba, MD while in the presence of Glendia JAYSON Barba, MD.  05/05/2022 4:17 PM   Burnard JONETTA Bohr [birth year] [id number]  Referring provider: Valora Agent, MD [address] [facility] [place],  [place] [address]  Chief Complaint  Patient presents with   Urinary Frequency    HPI: Veronica Flynn is a 58 y.o. female referred for lower evaluation of lower urinary tract symptoms.   6-12 month history of urinary frequency, nocturia x2-3, and intermittent episodes of sudden urgency, but no urge incontinence. In discomfort at time, though does have IBS. Denies UTI or gross hematuria. Previous hysterectomy and 2 prior vaginal deliveries. No previous evaluation or prior urologic history. She is a 0.25 pack per day smoker with a pack year history of <30 years.   PMH: Past Medical History:  Diagnosis Date   Arthritis    Chronic back pain    DDD   Chronic back pain    Complication of anesthesia    slow to wake up   Cough    nonproductive   DDD (degenerative disc disease), cervical    Depression    takes Paxil  daily   Diarrhea    GERD (gastroesophageal reflux disease)    takes Protonix  daily   History of bronchitis 2015   History of colon polyps    benign   Hypothyroidism    takes Synthroid  daily   IBS (irritable bowel syndrome)    Insomnia    doesn't take any meds   Joint pain    right elbow and right knee   Joint swelling    right knee   Pneumonia 6-7 yrs ago   hx of   Stress incontinence     Surgical History: Past Surgical History:  Procedure Laterality Date   ABDOMINAL HYSTERECTOMY     BACK SURGERY     BONE EXCISION Left 01/18/2020   Procedure: BONE SPUR EXCISION/SADDLEBONE;  Surgeon: Ashley Soulier, DPM;  Location: [facility];  Service: Podiatry;  Laterality: Left;   CERVICAL  SPINE SURGERY     fusion   CHOLECYSTECTOMY     COLONOSCOPY     ESOPHAGOGASTRODUODENOSCOPY     EYE SURGERY Left    as a child   KNEE ARTHROSCOPY Right 07/26/2015   Procedure: right knee arthroscopy, arthroscopic removal of plica, partial synovectomy,subchondroplasty;  Surgeon: Ozell Flake, MD;  Location: [facility];  Service: Orthopedics;  Laterality: Right;   LUMBAR LAMINECTOMY  dec 2016   SHOULDER ARTHROSCOPY Right 11/22/2021   Procedure: Right shoulder athroscopic rotator cuff repair, biceps tenodesis, distal clavicle excision, subacromial decompression;  Surgeon: Tobie Priest, MD;  Location: [facility];  Service: Orthopedics;  Laterality: Right;    Home Medications:  Allergies as of 05/06/2023       Reactions   Penicillins Swelling, Rash   Has patient had a PCN reaction causing immediate rash, facial/tongue/throat swelling, SOB or lightheadedness with hypotension: * Yes*   Sulfa Antibiotics Nausea And Vomiting   Tramadol Nausea And Vomiting   constipation   Penicillins Rash        Medication List        Accurate as of May 06, 2023  4:17 PM. If you have any questions, ask your nurse or doctor.          STOP taking  these medications    cholecalciferol 25 MCG (1000 UNIT) tablet Commonly known as: VITAMIN D3   ondansetron  4 MG disintegrating tablet Commonly known as: ZOFRAN -ODT   pantoprazole  40 MG tablet Commonly known as: PROTONIX        TAKE these medications    levothyroxine  100 MCG tablet Commonly known as: SYNTHROID  Take 150 mcg by mouth daily.        Allergies:  Allergies  Allergen Reactions   Penicillins Swelling and Rash    Has patient had a PCN reaction causing immediate rash, facial/tongue/throat swelling, SOB or lightheadedness with hypotension: * Yes*     Sulfa Antibiotics Nausea And Vomiting   Tramadol Nausea And Vomiting    constipation   Penicillins Rash    Social History:  reports that she has been smoking cigarettes.  She has a 20 pack-year smoking history. She has never used smokeless tobacco. She reports that she does not currently use alcohol. She reports that she does not use drugs.   Physical Exam: BP 122/84   Pulse 80   Ht 5' 7 (1.702 m)   Wt 203 lb (92.1 kg)   BMI 31.79 kg/m   Constitutional:  Alert and oriented, No acute distress. HEENT: [place] AT Respiratory: Normal respiratory effort, no increased work of breathing. Psychiatric: Normal mood and affect.   Urinalysis 2+ blood dipstick, microscopy 3-10 RBC/0-10 epis.    Assessment & Plan:    1. Microhematuria AUA hematuria risk stratification: intermediate.  We discussed the recommended evaluation of intermediate risk hematuria which consists of renal ultrasound and cystoscopy.  Since she does have lower urinary tract symptoms, we discussed the possibility that a non-obstructing distal ureteral calculus may be missed on renal ultrasound and would recommend a non-contrast CT of the abdomen and pelvis in lieu of renal ultrasound. These procedures were discussed and she desires to proceed with further evaluation.   2. Lower urinary tract symptoms We discussed most common etiology would be overactive bladder with other possible etiologies including a distal ureteral calculus and less commonly CIS of the bladder.  Further evaluation was CT and cystoscopy as above.    I have reviewed the above documentation for accuracy and completeness, and I agree with the above.   Glendia JAYSON Barba, MD  [facility] [address] [place], [place] [address] [phone]

## 2023-05-07 ENCOUNTER — Encounter: Payer: Self-pay | Admitting: Urology

## 2023-05-07 LAB — URINALYSIS, COMPLETE
Bilirubin, UA: NEGATIVE
Glucose, UA: NEGATIVE
Ketones, UA: NEGATIVE
Leukocytes,UA: NEGATIVE
Nitrite, UA: NEGATIVE
Protein,UA: NEGATIVE
Specific Gravity, UA: 1.015 (ref 1.005–1.030)
Urobilinogen, Ur: 0.2 mg/dL (ref 0.2–1.0)
pH, UA: 6 (ref 5.0–7.5)

## 2023-05-07 LAB — MICROSCOPIC EXAMINATION

## 2023-06-10 ENCOUNTER — Encounter: Payer: Self-pay | Admitting: Urology

## 2023-06-10 ENCOUNTER — Ambulatory Visit: Payer: Self-pay | Admitting: Urology

## 2023-06-30 DIAGNOSIS — H6123 Impacted cerumen, bilateral: Secondary | ICD-10-CM | POA: Diagnosis not present

## 2023-06-30 DIAGNOSIS — M48062 Spinal stenosis, lumbar region with neurogenic claudication: Secondary | ICD-10-CM | POA: Diagnosis not present

## 2023-06-30 DIAGNOSIS — E039 Hypothyroidism, unspecified: Secondary | ICD-10-CM | POA: Diagnosis not present

## 2023-06-30 DIAGNOSIS — E559 Vitamin D deficiency, unspecified: Secondary | ICD-10-CM | POA: Diagnosis not present

## 2023-06-30 DIAGNOSIS — E785 Hyperlipidemia, unspecified: Secondary | ICD-10-CM | POA: Diagnosis not present

## 2023-06-30 DIAGNOSIS — Z0000 Encounter for general adult medical examination without abnormal findings: Secondary | ICD-10-CM | POA: Diagnosis not present

## 2023-06-30 DIAGNOSIS — E538 Deficiency of other specified B group vitamins: Secondary | ICD-10-CM | POA: Diagnosis not present

## 2023-07-07 DIAGNOSIS — H5203 Hypermetropia, bilateral: Secondary | ICD-10-CM | POA: Diagnosis not present

## 2023-07-07 DIAGNOSIS — H52223 Regular astigmatism, bilateral: Secondary | ICD-10-CM | POA: Diagnosis not present

## 2023-07-07 DIAGNOSIS — H524 Presbyopia: Secondary | ICD-10-CM | POA: Diagnosis not present

## 2023-07-07 DIAGNOSIS — H259 Unspecified age-related cataract: Secondary | ICD-10-CM | POA: Diagnosis not present
